# Patient Record
Sex: Male | Born: 1940 | Race: White | Hispanic: No | Marital: Married | Smoking: Never smoker
Health system: Southern US, Community
[De-identification: ages and names within clinical notes are randomized; demographics above are authoritative.]

## PROBLEM LIST (undated history)

## (undated) DIAGNOSIS — N289 Disorder of kidney and ureter, unspecified: Secondary | ICD-10-CM

## (undated) DIAGNOSIS — I2510 Atherosclerotic heart disease of native coronary artery without angina pectoris: Secondary | ICD-10-CM

## (undated) HISTORY — PX: APPENDECTOMY: SHX54

---

## 2015-02-04 DIAGNOSIS — I2510 Atherosclerotic heart disease of native coronary artery without angina pectoris: Secondary | ICD-10-CM

## 2015-02-04 DIAGNOSIS — E119 Type 2 diabetes mellitus without complications: Secondary | ICD-10-CM | POA: Insufficient documentation

## 2015-10-21 ENCOUNTER — Other Ambulatory Visit: Payer: Self-pay | Admitting: Student

## 2015-10-21 DIAGNOSIS — M7582 Other shoulder lesions, left shoulder: Principal | ICD-10-CM

## 2015-10-21 DIAGNOSIS — M778 Other enthesopathies, not elsewhere classified: Secondary | ICD-10-CM

## 2015-10-23 DIAGNOSIS — E782 Mixed hyperlipidemia: Secondary | ICD-10-CM | POA: Diagnosis present

## 2015-11-04 ENCOUNTER — Ambulatory Visit
Admission: RE | Admit: 2015-11-04 | Discharge: 2015-11-04 | Disposition: A | Discharge status: Home / Self Care | Payer: Medicare Other | Source: Ambulatory Visit | Attending: Student | Admitting: Student

## 2015-11-04 DIAGNOSIS — R937 Abnormal findings on diagnostic imaging of other parts of musculoskeletal system: Secondary | ICD-10-CM | POA: Insufficient documentation

## 2015-11-04 DIAGNOSIS — M7582 Other shoulder lesions, left shoulder: Secondary | ICD-10-CM | POA: Insufficient documentation

## 2015-11-04 DIAGNOSIS — M7552 Bursitis of left shoulder: Secondary | ICD-10-CM | POA: Diagnosis not present

## 2015-11-04 DIAGNOSIS — M62512 Muscle wasting and atrophy, not elsewhere classified, left shoulder: Secondary | ICD-10-CM | POA: Diagnosis not present

## 2015-11-04 DIAGNOSIS — M778 Other enthesopathies, not elsewhere classified: Secondary | ICD-10-CM

## 2015-11-04 DIAGNOSIS — M659 Synovitis and tenosynovitis, unspecified: Secondary | ICD-10-CM | POA: Insufficient documentation

## 2016-04-20 DIAGNOSIS — M1A00X0 Idiopathic chronic gout, unspecified site, without tophus (tophi): Secondary | ICD-10-CM | POA: Diagnosis present

## 2016-11-11 ENCOUNTER — Other Ambulatory Visit: Payer: Self-pay | Admitting: Anesthesiology

## 2016-11-11 DIAGNOSIS — M5416 Radiculopathy, lumbar region: Secondary | ICD-10-CM

## 2016-11-17 ENCOUNTER — Ambulatory Visit
Admission: RE | Admit: 2016-11-17 | Discharge: 2016-11-17 | Disposition: A | Discharge status: Home / Self Care | Payer: Medicare Other | Source: Ambulatory Visit | Attending: Anesthesiology | Admitting: Anesthesiology

## 2016-11-17 DIAGNOSIS — M4316 Spondylolisthesis, lumbar region: Secondary | ICD-10-CM | POA: Diagnosis not present

## 2016-11-17 DIAGNOSIS — I700 Atherosclerosis of aorta: Secondary | ICD-10-CM | POA: Diagnosis not present

## 2016-11-17 DIAGNOSIS — M8938 Hypertrophy of bone, other site: Secondary | ICD-10-CM | POA: Insufficient documentation

## 2016-11-17 DIAGNOSIS — Z981 Arthrodesis status: Secondary | ICD-10-CM | POA: Insufficient documentation

## 2016-11-17 DIAGNOSIS — M5416 Radiculopathy, lumbar region: Secondary | ICD-10-CM | POA: Diagnosis present

## 2017-06-22 ENCOUNTER — Other Ambulatory Visit: Payer: Self-pay | Admitting: Anesthesiology

## 2017-06-22 DIAGNOSIS — M5414 Radiculopathy, thoracic region: Secondary | ICD-10-CM

## 2017-07-01 ENCOUNTER — Ambulatory Visit
Admission: RE | Admit: 2017-07-01 | Discharge: 2017-07-01 | Disposition: A | Discharge status: Home / Self Care | Payer: Medicare Other | Source: Ambulatory Visit | Attending: Anesthesiology | Admitting: Anesthesiology

## 2017-07-01 DIAGNOSIS — M5414 Radiculopathy, thoracic region: Secondary | ICD-10-CM | POA: Diagnosis present

## 2017-07-01 DIAGNOSIS — M4724 Other spondylosis with radiculopathy, thoracic region: Secondary | ICD-10-CM | POA: Diagnosis not present

## 2017-08-31 HISTORY — PX: OTHER SURGICAL HISTORY: SHX169

## 2017-09-04 ENCOUNTER — Emergency Department
Admission: EM | Admit: 2017-09-04 | Discharge: 2017-09-04 | Disposition: A | Discharge status: Home / Self Care | Payer: Medicare Other | Attending: Emergency Medicine | Admitting: Emergency Medicine

## 2017-09-04 ENCOUNTER — Other Ambulatory Visit: Payer: Self-pay

## 2017-09-04 ENCOUNTER — Encounter: Payer: Self-pay | Admitting: Emergency Medicine

## 2017-09-04 ENCOUNTER — Emergency Department: Payer: Medicare Other

## 2017-09-04 DIAGNOSIS — K5900 Constipation, unspecified: Secondary | ICD-10-CM | POA: Insufficient documentation

## 2017-09-04 MED ORDER — LACTULOSE 20 G PO PACK: 20.0000 g | PACK | Freq: Three times a day (TID) | ORAL | 0 refills | Status: AC

## 2017-09-04 NOTE — ED Triage Notes (Signed)
Spoke to the surgeon re: the back pain and he increased the oxycodone from 5 to 10mg  q 6 hrs.

## 2017-09-04 NOTE — ED Provider Notes (Signed)
[facility] Emergency Department Provider Note  ____________________________________________   I have reviewed the triage vital signs and the nursing notes. Where available I have reviewed prior notes and, if possible and indicated, outside hospital notes.    HISTORY  Chief Complaint No chief complaint on file.    HPI Alec White is a 77 y.o. male  Who had recent surgery on his back, and has been on narcotic pain medication since Wednesday, he has not had a bowel movement this week.  He is not placed on a stool softeners.  He has no numbness or weakness, he does feels constipated.  He has no incontinence of bowel or bladder he has no fever no chills no abdominal pain, no chest pain or shortness of breath, no weakness he just states he is having trouble with bowel movements after taking narcotics.   History reviewed. No pertinent past medical history.  There are no active problems to display for this patient.   Past Surgical History:  Procedure Laterality Date  . spinal neurostimulator  08/31/2017    Prior to Admission medications   Not on File    Allergies Patient has no known allergies.  History reviewed. No pertinent family history.  Social History Social History   Tobacco Use  . Smoking status: Never Smoker  . Smokeless tobacco: Never Used  Substance Use Topics  . Alcohol use: Not Currently  . Drug use: Not on file    Review of Systems Constitutional: No fever/chills Eyes: No visual changes. ENT: No sore throat. No stiff neck no neck pain Cardiovascular: Denies chest pain. Respiratory: Denies shortness of breath. Gastrointestinal:   no vomiting.  No diarrhea.  No constipation. Genitourinary: Negative for dysuria. Musculoskeletal: Negative lower extremity swelling Skin: Negative for rash. Neurological: Negative for severe headaches, focal weakness or numbness.   ____________________________________________   PHYSICAL  EXAM:  VITAL SIGNS: ED Triage Vitals  Enc Vitals Group     BP 09/04/17 1704 (!) 151/62     Pulse Rate 09/04/17 1704 73     Resp 09/04/17 1704 18     Temp 09/04/17 1704 98.7 F (37.1 C)     Temp src --      SpO2 09/04/17 1704 97 %     Weight 09/04/17 1708 206 lb (93.4 kg)     Height 09/04/17 1708 5\' 8"  (1.727 m)     Head Circumference --      Peak Flow --      Pain Score 09/04/17 1708 9     Pain Loc --      Pain Edu? --      Excl. in [place]? --     Constitutional: Alert and oriented. Well appearing and in no acute distress. Eyes: Conjunctivae are normal Head: Atraumatic HEENT: No congestion/rhinnorhea. Mucous membranes are moist.  Oropharynx non-erythematous Neck:   Nontender with no meningismus, no masses, no stridor Cardiovascular: Normal rate, regular rhythm. Grossly normal heart sounds.  Good peripheral circulation. Respiratory: Normal respiratory effort.  No retractions. Lungs CTAB. Abdominal: Soft and nontender. No distention. No guarding no rebound Back:  There is no focal tenderness or step off.  there is no midline tenderness there are no lesions noted. there is no CVA tenderness Musculoskeletal: No lower extremity tenderness, no upper extremity tenderness. No joint effusions, no DVT signs strong distal pulses no edema Neurologic:  Normal speech and language. No gross focal neurologic deficits are appreciated.  Good rectal tone, symmetric reflexes no saddle anesthesia Skin:  Skin is warm, dry and intact. No rash noted. Psychiatric: Mood and affect are normal. Speech and behavior are normal.  ____________________________________________   LABS (all labs ordered are listed, but only abnormal results are displayed)  Labs Reviewed - No data to display  Pertinent labs  results that were available during my care of the patient were reviewed by me and considered in my medical decision making (see chart for details). ____________________________________________  EKG  I  personally interpreted any EKGs ordered by me or triage  ____________________________________________  RADIOLOGY  Pertinent labs & imaging results that were available during my care of the patient were reviewed by me and considered in my medical decision making (see chart for details). If possible, patient and/or family made aware of any abnormal findings.  Dg Abdomen 1 View  Result Date: 09/04/2017 CLINICAL DATA:  Constipation, recent back surgery EXAM: ABDOMEN - 1 VIEW COMPARISON:  CT 11/17/2016 FINDINGS: Bilateral lung bases are clear. Ascending stimulator leads projecting at approximate T8-T9 level. Nonobstructed bowel-gas pattern with moderate to large stool in the colon. Surgical hardware in the lumbosacral spine. Multiple calcifications projecting over the expected location of left kidney. Probable phleboliths in the left pelvis. IMPRESSION: 1. Nonobstructed gas pattern with moderate to large amount of stool in the colon 2. Multiple calcifications overlying the expected location of left kidney consistent with renal calculi. Electronically Signed   By: Donavan Foil M.D.   On: 09/04/2017 18:05   ____________________________________________    PROCEDURES  Procedure(s) performed: None  Procedures  Critical Care performed: None  ____________________________________________   INITIAL IMPRESSION / ASSESSMENT AND PLAN / ED COURSE  Pertinent labs & imaging results that were available during my care of the patient were reviewed by me and considered in my medical decision making (see chart for details).  Here with constipation no warning signs to suggest that he has a neurologic pathology causing this even though he did have recent implantation of a non-activated nerve stimulator.  The site looks good.  We offered an enema but he would prefer not to have an enema.  We then did an disimpaction with very good success.  Patient eager to go home he would like to start taking medications at  home.  We will prescribe him medications that should work with his narcotics.  We will also encourage him to take Colace which he started taking today, and he understands he can take over-the-counter laxatives as needed.  Any neurologic complaints of course understands would mandate return.    ____________________________________________   FINAL CLINICAL IMPRESSION(S) / ED DIAGNOSES  Final diagnoses:  None      This chart was dictated using voice recognition software.  Despite best efforts to proofread,  errors can occur which can change meaning.      Schuyler Amor, MD 09/04/17 1924

## 2017-09-04 NOTE — Discharge Instructions (Signed)
Return to the emergency room for any new or worsening symptoms abdominal pain, fever, vomiting, numbness, weakness, or you have any other new or worrisome symptoms.  Continue taking the Colace and try the lactulose to continue with your bowel prep.

## 2017-09-04 NOTE — ED Triage Notes (Signed)
Back surgery for spine stimulator on Wednesday. Constipated since then. Took a stool softener 2x today and the preceding days as well with no results.

## 2018-07-12 ENCOUNTER — Other Ambulatory Visit (HOSPITAL_COMMUNITY): Payer: Self-pay | Admitting: Internal Medicine

## 2018-07-12 ENCOUNTER — Other Ambulatory Visit: Payer: Self-pay | Admitting: Internal Medicine

## 2018-07-12 DIAGNOSIS — N183 Chronic kidney disease, stage 3 unspecified: Secondary | ICD-10-CM

## 2018-07-14 ENCOUNTER — Other Ambulatory Visit: Payer: Self-pay

## 2018-07-14 ENCOUNTER — Ambulatory Visit
Admission: RE | Admit: 2018-07-14 | Discharge: 2018-07-14 | Disposition: A | Discharge status: Home / Self Care | Payer: Medicare Other | Source: Ambulatory Visit | Attending: Internal Medicine | Admitting: Internal Medicine

## 2018-07-14 DIAGNOSIS — N183 Chronic kidney disease, stage 3 unspecified: Secondary | ICD-10-CM

## 2018-07-20 ENCOUNTER — Other Ambulatory Visit: Payer: Self-pay | Admitting: Internal Medicine

## 2018-07-20 ENCOUNTER — Other Ambulatory Visit (HOSPITAL_COMMUNITY): Payer: Self-pay | Admitting: Internal Medicine

## 2018-07-20 DIAGNOSIS — N289 Disorder of kidney and ureter, unspecified: Secondary | ICD-10-CM

## 2018-07-20 DIAGNOSIS — N281 Cyst of kidney, acquired: Secondary | ICD-10-CM

## 2018-07-25 ENCOUNTER — Other Ambulatory Visit: Payer: Self-pay | Admitting: Internal Medicine

## 2018-07-25 ENCOUNTER — Ambulatory Visit
Admission: RE | Admit: 2018-07-25 | Discharge: 2018-07-25 | Disposition: A | Discharge status: Home / Self Care | Payer: Medicare Other | Source: Ambulatory Visit | Attending: Internal Medicine | Admitting: Internal Medicine

## 2018-07-25 ENCOUNTER — Other Ambulatory Visit: Payer: Self-pay

## 2018-07-25 DIAGNOSIS — N281 Cyst of kidney, acquired: Secondary | ICD-10-CM | POA: Diagnosis present

## 2018-07-25 DIAGNOSIS — N289 Disorder of kidney and ureter, unspecified: Secondary | ICD-10-CM | POA: Diagnosis present

## 2021-02-19 ENCOUNTER — Other Ambulatory Visit: Payer: Self-pay | Admitting: Internal Medicine

## 2021-02-19 DIAGNOSIS — N184 Chronic kidney disease, stage 4 (severe): Secondary | ICD-10-CM

## 2021-02-19 DIAGNOSIS — N183 Chronic kidney disease, stage 3 unspecified: Secondary | ICD-10-CM

## 2021-02-19 DIAGNOSIS — E1122 Type 2 diabetes mellitus with diabetic chronic kidney disease: Secondary | ICD-10-CM

## 2021-03-02 ENCOUNTER — Ambulatory Visit
Admission: RE | Admit: 2021-03-02 | Discharge: 2021-03-02 | Disposition: A | Discharge status: Home / Self Care | Payer: Medicare Other | Source: Ambulatory Visit | Attending: Internal Medicine | Admitting: Internal Medicine

## 2021-03-02 DIAGNOSIS — N184 Chronic kidney disease, stage 4 (severe): Secondary | ICD-10-CM | POA: Insufficient documentation

## 2021-03-02 DIAGNOSIS — N183 Chronic kidney disease, stage 3 unspecified: Secondary | ICD-10-CM | POA: Diagnosis present

## 2021-03-02 DIAGNOSIS — E1122 Type 2 diabetes mellitus with diabetic chronic kidney disease: Secondary | ICD-10-CM | POA: Insufficient documentation

## 2021-06-10 DIAGNOSIS — E1122 Type 2 diabetes mellitus with diabetic chronic kidney disease: Secondary | ICD-10-CM | POA: Diagnosis present

## 2021-06-10 DIAGNOSIS — N400 Enlarged prostate without lower urinary tract symptoms: Secondary | ICD-10-CM | POA: Diagnosis present

## 2022-07-15 IMAGING — US US RENAL
1 series · 14 of 25 positions shown · non-contrast
Comparison: Ultrasound 07/14/2018

CLINICAL DATA: Chronic kidney disease

EXAM:
RENAL / URINARY TRACT ULTRASOUND COMPLETE

[Series 1: us renal · 0.26mm/px · 14 of 45 slices shown]
[im 1/45]
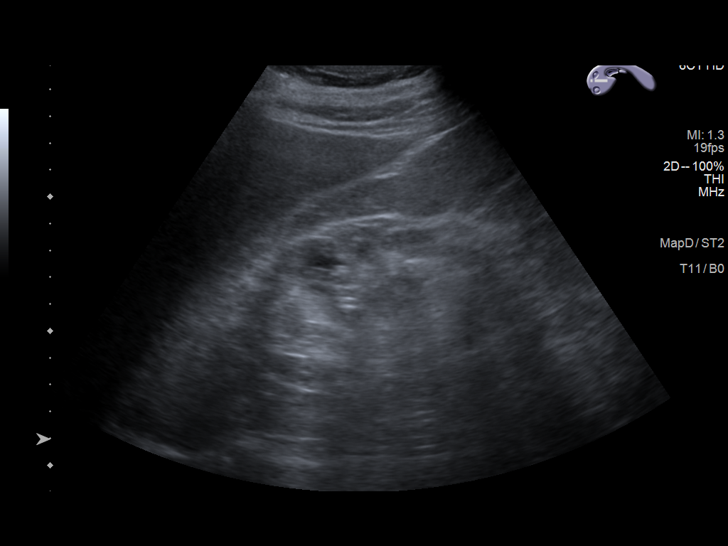
[im 4/45]
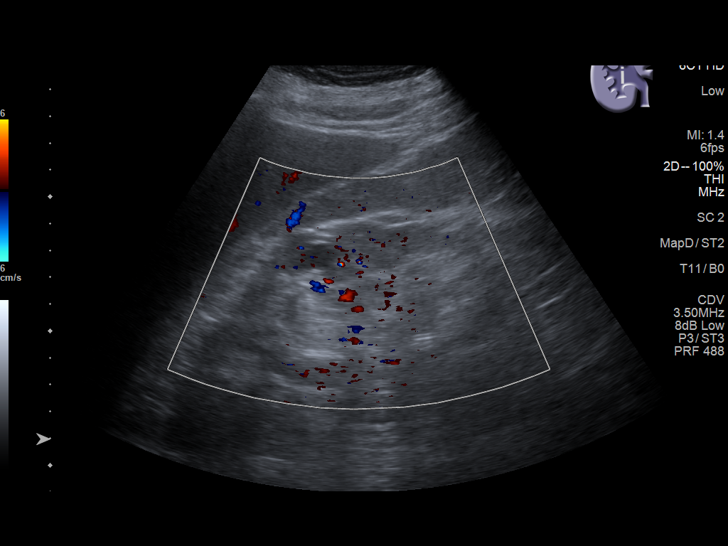
[im 8/45]
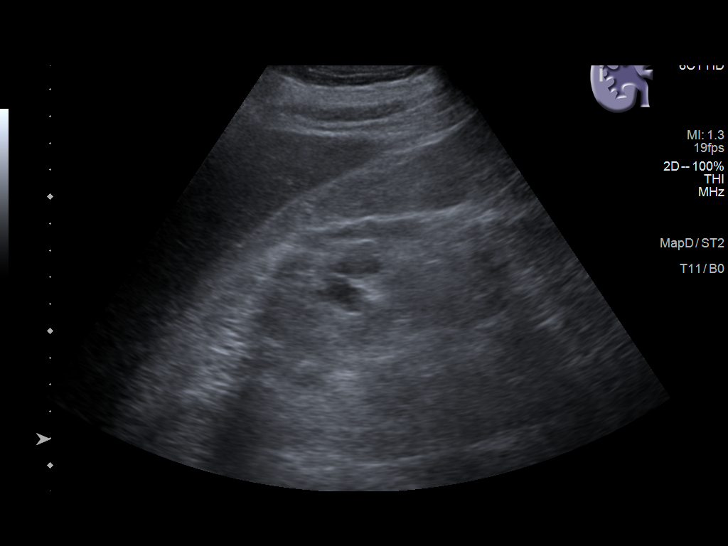
[im 12/45]
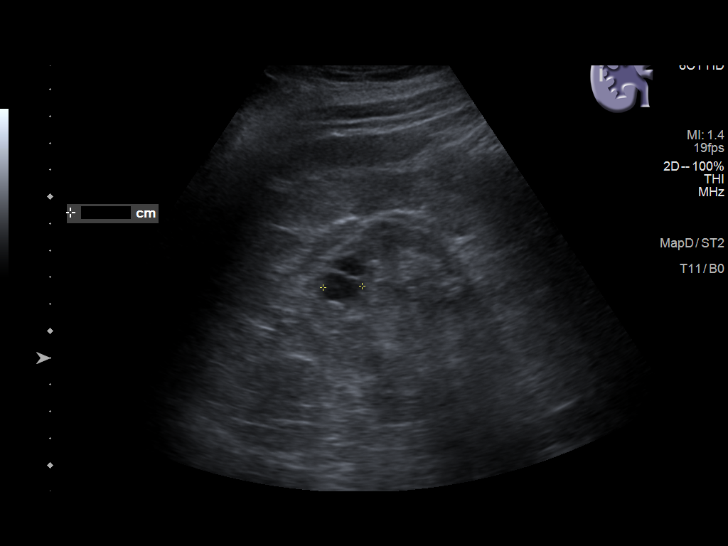
[im 15/45]
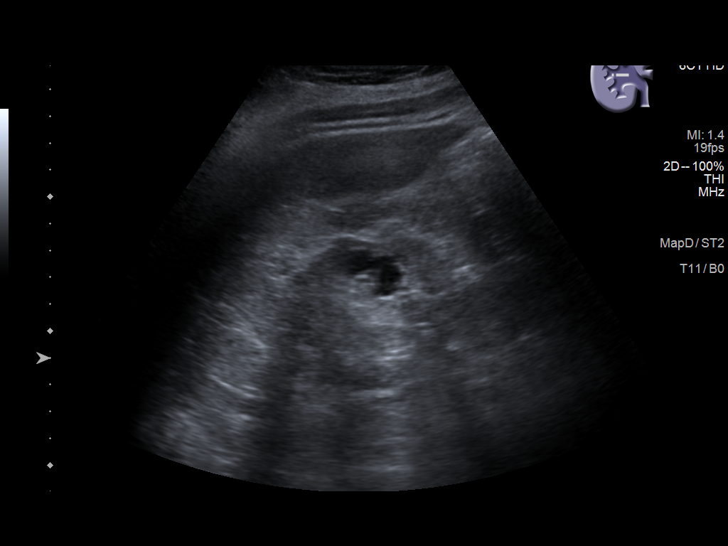
[im 17/45]
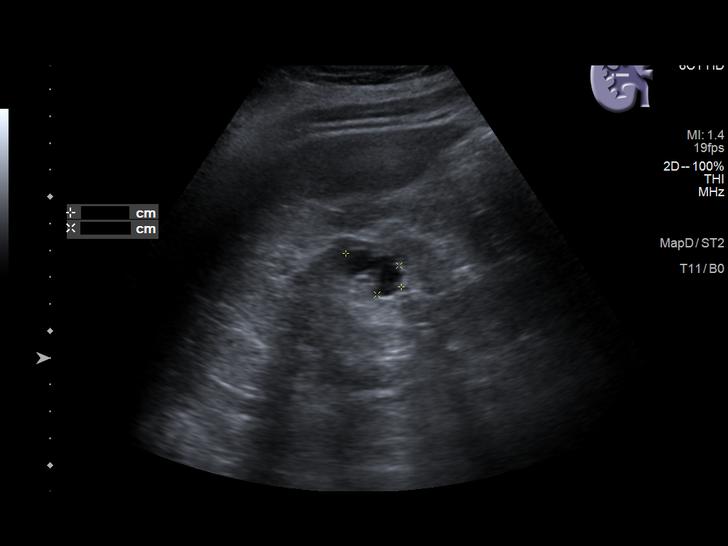
[im 21/45]
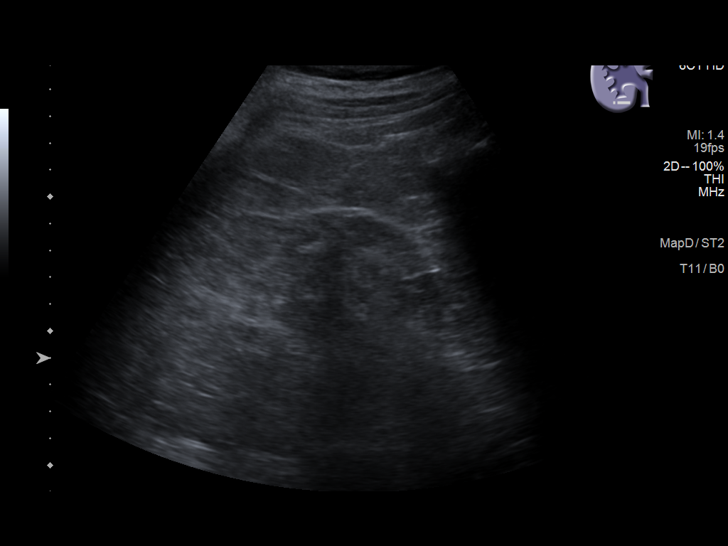
[im 24/45]
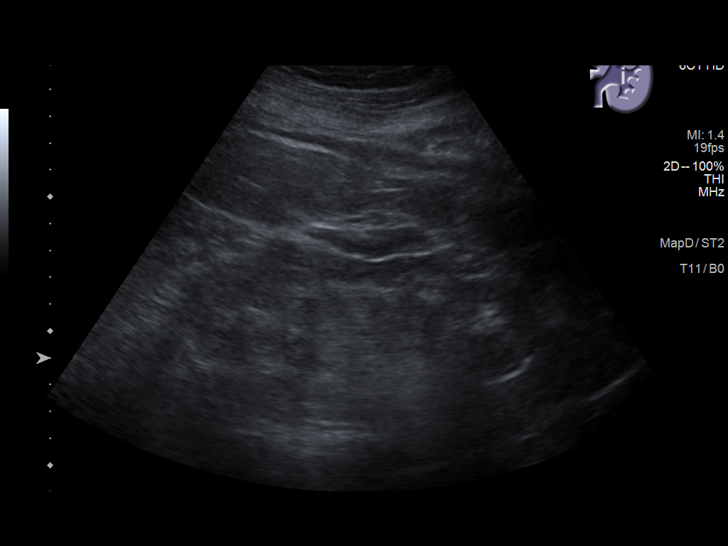
[im 28/45]
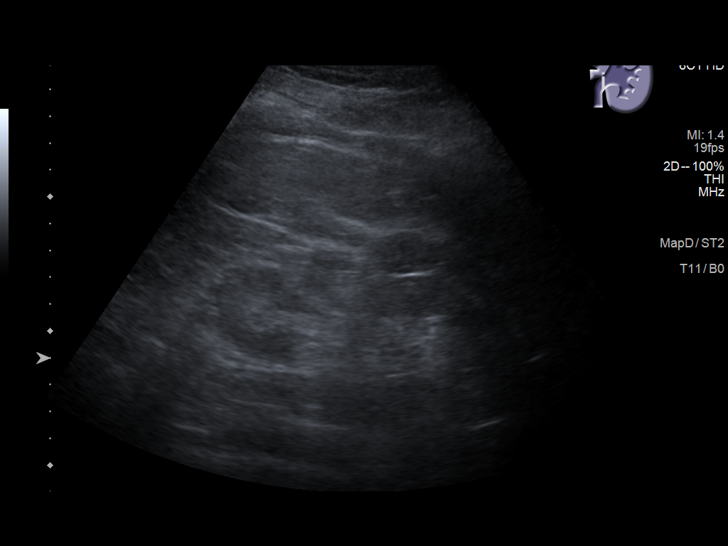
[im 30/45]
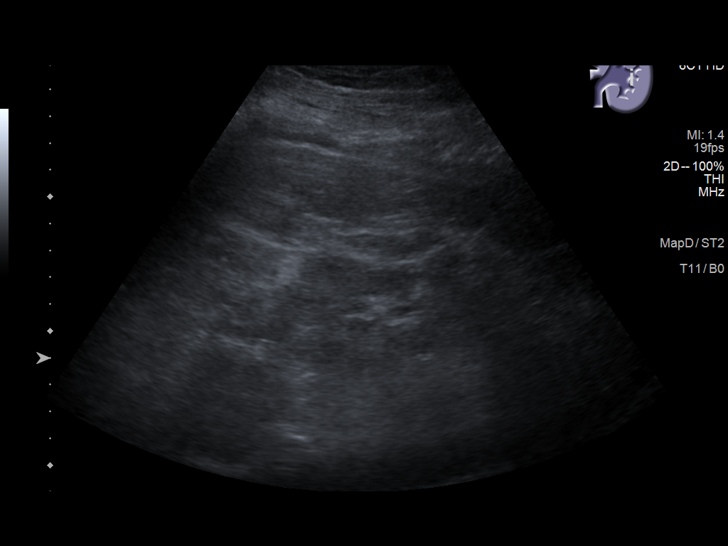
[im 34/45]
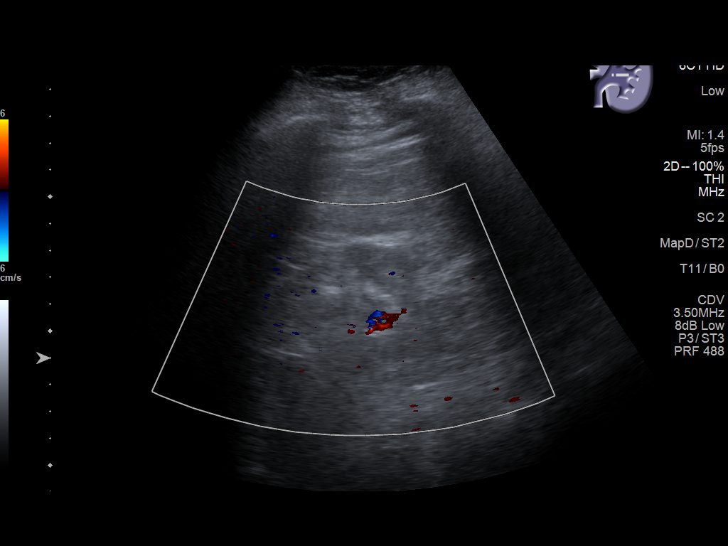
[im 37/45]
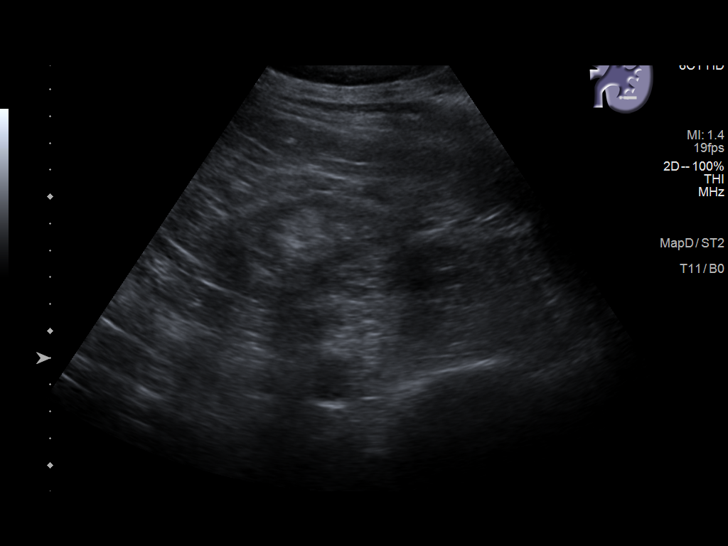
[im 41/45]
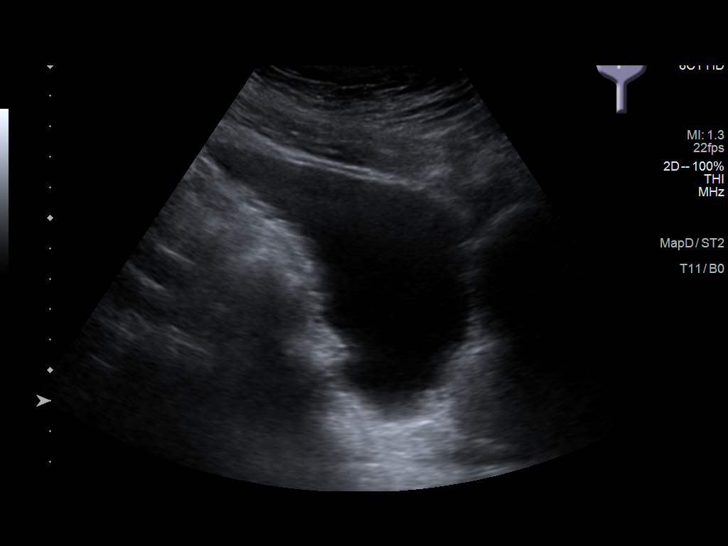
[im 45/45]
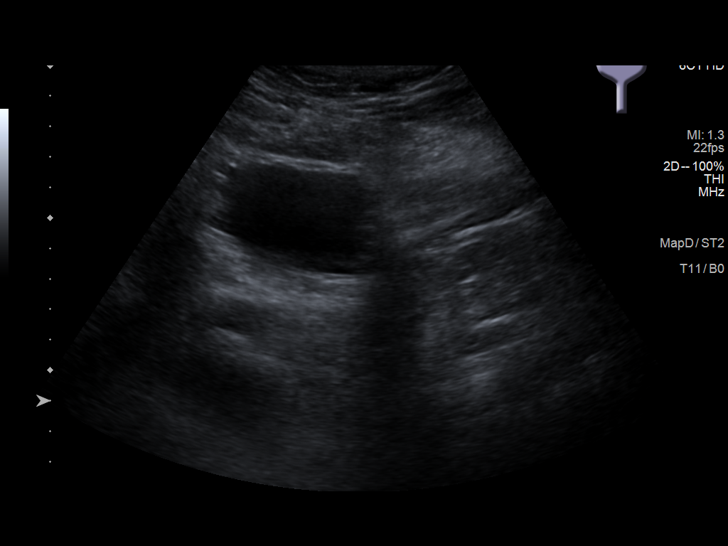

[14 of 25 positions shown; findings below may reference images not displayed]

FINDINGS: Right Kidney:

Renal measurements: 10.2 x 6.1 x 6.2 cm = volume: 200.6 mL. Cortex
is echogenic with diffuse cortical thinning. No hydronephrosis. Cyst
at the midpole measuring 2.4 x 1.4 by 1.8 cm. Cyst at the upper pole
measuring 1.4 x 1 x 1.5 cm.

Left Kidney:

Renal measurements: 13.5 x 5.8 x 6 cm = volume: 244.3 mL. Cortex is
echogenic with diffuse cortical thinning. No hydronephrosis. Solid
echogenic mass at the midpole measuring 1.3 x 1.3 x 1.6 cm,
previously 14 x 10 x 19 mm.

Bladder:

Appears normal for degree of bladder distention.

Other:

None.
IMPRESSION: 1. Echogenic kidneys with diffuse cortical thinning consistent with
medical renal disease and atrophy. No hydronephrosis
2. Simple appearing cysts in the right kidney
3. Stable hyperechoic solid lesion mid left kidney measuring 16 mm
suggestive of small angiomyolipoma.

## 2022-11-19 ENCOUNTER — Other Ambulatory Visit: Payer: Self-pay

## 2022-11-19 ENCOUNTER — Emergency Department: Payer: Medicare Other

## 2022-11-19 ENCOUNTER — Emergency Department
Admission: EM | Admit: 2022-11-19 | Discharge: 2022-11-19 | Disposition: A | Discharge status: Home / Self Care | Payer: Medicare Other | Attending: Emergency Medicine | Admitting: Emergency Medicine

## 2022-11-19 DIAGNOSIS — W1839XA Other fall on same level, initial encounter: Secondary | ICD-10-CM | POA: Insufficient documentation

## 2022-11-19 DIAGNOSIS — E119 Type 2 diabetes mellitus without complications: Secondary | ICD-10-CM | POA: Insufficient documentation

## 2022-11-19 DIAGNOSIS — S7002XA Contusion of left hip, initial encounter: Secondary | ICD-10-CM | POA: Insufficient documentation

## 2022-11-19 DIAGNOSIS — W19XXXA Unspecified fall, initial encounter: Secondary | ICD-10-CM

## 2022-11-19 DIAGNOSIS — M25552 Pain in left hip: Secondary | ICD-10-CM | POA: Diagnosis present

## 2022-11-19 MED ORDER — ACETAMINOPHEN 325 MG PO TABS
650.0000 mg | ORAL_TABLET | Freq: Once | ORAL | Status: AC
  Administered 2022-11-19: 650 mg via ORAL
  Filled 2022-11-19: qty 2

## 2022-11-19 NOTE — ED Provider Notes (Signed)
[facility] Provider Note    Event Date/Time   First MD Initiated Contact with Patient 11/19/22 1214     (approximate)   History   Fall   HPI  Alec White is a 82 y.o. male who presents today for evaluation of left hip pain.  Patient reports that he lost his balance and fell directly onto his left hip approximately 1 hour ago.  He reports that he has not been able to bear weight since that happened given his discomfort.  No paresthesias.  There is no history of or LOC.  He does not take anticoagulation.  He denies any other injuries or pain.  There are no problems to display for this patient.         Physical Exam   Triage Vital Signs: ED Triage Vitals  Encounter Vitals Group     BP 11/19/22 1146 (!) 175/73     Systolic BP Percentile --      Diastolic BP Percentile --      Pulse Rate 11/19/22 1146 83     Resp 11/19/22 1146 18     Temp 11/19/22 1146 98 F (36.7 C)     Temp src --      SpO2 11/19/22 1146 97 %     Weight 11/19/22 1146 192 lb (87.1 kg)     Height 11/19/22 1146 5\' 7"  (1.702 m)     Head Circumference --      Peak Flow --      Pain Score 11/19/22 1145 6     Pain Loc --      Pain Education --      Exclude from Growth Chart --     Most recent vital signs: Vitals:   11/19/22 1146 11/19/22 1412  BP: (!) 175/73 (!) 185/82  Pulse: 83 82  Resp: 18 16  Temp: 98 F (36.7 C)   SpO2: 97% 98%    Physical Exam Vitals and nursing note reviewed.  Constitutional:      General: Awake and alert. No acute distress.    Appearance: Normal appearance. The patient is normal weight.  HENT:     Head: Normocephalic and atraumatic.     Mouth: Mucous membranes are moist.  Eyes:     General: PERRL. Normal EOMs        Right eye: No discharge.        Left eye: No discharge.     Conjunctiva/sclera: Conjunctivae normal.  Cardiovascular:     Rate and Rhythm: Normal rate and regular rhythm.     Pulses: Normal pulses.  Pulmonary:      Effort: Pulmonary effort is normal. No respiratory distress.     Breath sounds: Normal breath sounds.  Abdominal:     Abdomen is soft. There is no abdominal tenderness. No rebound or guarding. No distention. Pelvis stable.  Negative logroll bilaterally.  Patient is able to lift bilateral legs up off of the stretcher, 1 at a time.  He has 2+ pedal pulses.  There is no skin injury, erythema, or ecchymosis noted. Musculoskeletal:        General: No swelling. Normal range of motion.     Cervical back: Normal range of motion and neck supple.  Skin:    General: Skin is warm and dry.     Capillary Refill: Capillary refill takes less than 2 seconds.     Findings: No rash.  Neurological:     Mental Status: The patient is awake  and alert.      ED Results / Procedures / Treatments   Labs (all labs ordered are listed, but only abnormal results are displayed) Labs Reviewed - No data to display   EKG     RADIOLOGY I independently reviewed and interpreted imaging and agree with radiologists findings.     PROCEDURES:  Critical Care performed:   Procedures   MEDICATIONS ORDERED IN ED: Medications  acetaminophen (TYLENOL) tablet 650 mg (650 mg Oral Given 11/19/22 1403)     IMPRESSION / MDM / ASSESSMENT AND PLAN / ED COURSE  I reviewed the triage vital signs and the nursing notes.   Differential diagnosis includes, but is not limited to, contusion, fracture, dislocation.    Patient is awake and alert, hemodynamically stable and afebrile.  There is no obvious deformity, rotation, or leg shortening on exam.  He has normal distal pulses.  X-ray obtained is negative for any acute bony injury.  Patient and family are reassured by these results.  He had initially declined analgesia, though then agreed to Tylenol which was provided.  We discussed return precautions and outpatient follow-up.  He is given the information for orthopedics if his symptoms persist.  He understands return  precautions in the meantime.  He and his family members are comfortable with plan.  He was discharged in stable condition with his family.   Patient's presentation is most consistent with acute complicated illness / injury requiring diagnostic workup.    FINAL CLINICAL IMPRESSION(S) / ED DIAGNOSES   Final diagnoses:  Fall, initial encounter  Contusion of left hip, initial encounter     Rx / DC Orders   ED Discharge Orders     None        Note:  This document was prepared using Dragon voice recognition software and may include unintentional dictation errors.   Jackelyn Hoehn, PA-C 11/19/22 1414    Janith Lima, MD 11/19/22 [name]

## 2022-11-19 NOTE — ED Triage Notes (Signed)
[facility] home ems.  Sitting in chair and stood and his legs gave out and fell on left hip.  No deformity.  Painful to wt bear.  Hx DM  bs 206 by ems.  Vss 164/80

## 2022-11-19 NOTE — ED Triage Notes (Signed)
Pt comes via EMS from home with c/o fall. Pt stats he has bad back and loss his balance falling. Pt states no loc or hitting head. Pt states he just hit his back side.

## 2022-11-19 NOTE — Discharge Instructions (Signed)
Your x-ray did not show any broken bones.  You may take Tylenol 650 mg every 6-8 hours as needed for pain.  Please follow-up with orthopedics if your symptoms persist.  Please return for any new, worsening, or change in symptoms or other concerns.  It was a pleasure caring for you today.

## 2022-11-25 ENCOUNTER — Emergency Department: Payer: Medicare Other

## 2022-11-25 ENCOUNTER — Inpatient Hospital Stay
Admission: EM | Admit: 2022-11-25 | Discharge: 2022-11-27 | DRG: 481 | Disposition: A | Discharge status: Home Health | Payer: Medicare Other | Source: Ambulatory Visit | Attending: Internal Medicine | Admitting: Internal Medicine

## 2022-11-25 ENCOUNTER — Other Ambulatory Visit: Payer: Self-pay

## 2022-11-25 ENCOUNTER — Encounter: Payer: Self-pay | Admitting: *Deleted

## 2022-11-25 DIAGNOSIS — I2510 Atherosclerotic heart disease of native coronary artery without angina pectoris: Secondary | ICD-10-CM | POA: Diagnosis present

## 2022-11-25 DIAGNOSIS — M25552 Pain in left hip: Secondary | ICD-10-CM | POA: Diagnosis present

## 2022-11-25 DIAGNOSIS — E782 Mixed hyperlipidemia: Secondary | ICD-10-CM | POA: Diagnosis present

## 2022-11-25 DIAGNOSIS — S72012A Unspecified intracapsular fracture of left femur, initial encounter for closed fracture: Principal | ICD-10-CM | POA: Diagnosis present

## 2022-11-25 DIAGNOSIS — S72002A Fracture of unspecified part of neck of left femur, initial encounter for closed fracture: Secondary | ICD-10-CM | POA: Diagnosis not present

## 2022-11-25 DIAGNOSIS — M1A9XX0 Chronic gout, unspecified, without tophus (tophi): Secondary | ICD-10-CM | POA: Diagnosis present

## 2022-11-25 DIAGNOSIS — Z79899 Other long term (current) drug therapy: Secondary | ICD-10-CM

## 2022-11-25 DIAGNOSIS — E039 Hypothyroidism, unspecified: Secondary | ICD-10-CM | POA: Insufficient documentation

## 2022-11-25 DIAGNOSIS — E1122 Type 2 diabetes mellitus with diabetic chronic kidney disease: Secondary | ICD-10-CM | POA: Diagnosis present

## 2022-11-25 DIAGNOSIS — Z955 Presence of coronary angioplasty implant and graft: Secondary | ICD-10-CM

## 2022-11-25 DIAGNOSIS — W19XXXA Unspecified fall, initial encounter: Secondary | ICD-10-CM | POA: Diagnosis present

## 2022-11-25 DIAGNOSIS — Z9682 Presence of neurostimulator: Secondary | ICD-10-CM | POA: Diagnosis not present

## 2022-11-25 DIAGNOSIS — G8929 Other chronic pain: Secondary | ICD-10-CM | POA: Insufficient documentation

## 2022-11-25 DIAGNOSIS — I10 Essential (primary) hypertension: Secondary | ICD-10-CM | POA: Diagnosis not present

## 2022-11-25 DIAGNOSIS — N400 Enlarged prostate without lower urinary tract symptoms: Secondary | ICD-10-CM | POA: Diagnosis present

## 2022-11-25 DIAGNOSIS — M1A00X0 Idiopathic chronic gout, unspecified site, without tophus (tophi): Secondary | ICD-10-CM | POA: Diagnosis not present

## 2022-11-25 DIAGNOSIS — N184 Chronic kidney disease, stage 4 (severe): Secondary | ICD-10-CM

## 2022-11-25 DIAGNOSIS — R296 Repeated falls: Secondary | ICD-10-CM | POA: Diagnosis present

## 2022-11-25 DIAGNOSIS — I129 Hypertensive chronic kidney disease with stage 1 through stage 4 chronic kidney disease, or unspecified chronic kidney disease: Secondary | ICD-10-CM | POA: Diagnosis present

## 2022-11-25 DIAGNOSIS — Z79890 Hormone replacement therapy (postmenopausal): Secondary | ICD-10-CM | POA: Diagnosis not present

## 2022-11-25 DIAGNOSIS — Z7982 Long term (current) use of aspirin: Secondary | ICD-10-CM

## 2022-11-25 DIAGNOSIS — Z981 Arthrodesis status: Secondary | ICD-10-CM | POA: Diagnosis not present

## 2022-11-25 DIAGNOSIS — Z7984 Long term (current) use of oral hypoglycemic drugs: Secondary | ICD-10-CM

## 2022-11-25 DIAGNOSIS — Z79891 Long term (current) use of opiate analgesic: Secondary | ICD-10-CM

## 2022-11-25 DIAGNOSIS — Z9861 Coronary angioplasty status: Secondary | ICD-10-CM | POA: Diagnosis not present

## 2022-11-25 LAB — CBC WITH DIFFERENTIAL/PLATELET
Abs Immature Granulocytes: 0.03 10*3/uL (ref 0.00–0.07)
Basophils Absolute: 0 10*3/uL (ref 0.0–0.1)
Basophils Relative: 1 %
Eosinophils Absolute: 0.3 10*3/uL (ref 0.0–0.5)
Eosinophils Relative: 5 %
HCT: 42.5 % (ref 39.0–52.0)
Hemoglobin: 13.7 g/dL (ref 13.0–17.0)
Immature Granulocytes: 1 %
Lymphocytes Relative: 23 %
Lymphs Abs: 1.5 10*3/uL (ref 0.7–4.0)
MCH: 30.2 pg (ref 26.0–34.0)
MCHC: 32.2 g/dL (ref 30.0–36.0)
MCV: 93.6 fL (ref 80.0–100.0)
Monocytes Absolute: 0.4 10*3/uL (ref 0.1–1.0)
Monocytes Relative: 7 %
Neutro Abs: 4.1 10*3/uL (ref 1.7–7.7)
Neutrophils Relative %: 63 %
Platelets: 122 10*3/uL — ABNORMAL LOW (ref 150–400)
RBC: 4.54 MIL/uL (ref 4.22–5.81)
RDW: 13.2 % (ref 11.5–15.5)
WBC: 6.3 10*3/uL (ref 4.0–10.5)
nRBC: 0 % (ref 0.0–0.2)

## 2022-11-25 LAB — BASIC METABOLIC PANEL
Anion gap: 14 (ref 5–15)
BUN: 46 mg/dL — ABNORMAL HIGH (ref 8–23)
CO2: 23 mmol/L (ref 22–32)
Calcium: 9.6 mg/dL (ref 8.9–10.3)
Chloride: 103 mmol/L (ref 98–111)
Creatinine, Ser: 2.45 mg/dL — ABNORMAL HIGH (ref 0.61–1.24)
GFR, Estimated: 26 mL/min — ABNORMAL LOW (ref >60)
Glucose, Bld: 126 mg/dL — ABNORMAL HIGH (ref 70–99)
Potassium: 4.6 mmol/L (ref 3.5–5.1)
Sodium: 140 mmol/L (ref 135–145)

## 2022-11-25 LAB — PROTIME-INR
INR: 1.1 (ref 0.8–1.2)
Prothrombin Time: 14 s (ref 11.4–15.2)

## 2022-11-25 MED ORDER — MORPHINE SULFATE (PF) 2 MG/ML IV SOLN
0.5000 mg | INTRAVENOUS | Status: DC | PRN
  Administered 2022-11-26: 0.5 mg via INTRAVENOUS
  Filled 2022-11-25: qty 1

## 2022-11-25 MED ORDER — HYDRALAZINE HCL 20 MG/ML IJ SOLN
5.0000 mg | INTRAMUSCULAR | Status: DC | PRN
  Administered 2022-11-26: 5 mg via INTRAVENOUS
  Filled 2022-11-25: qty 1

## 2022-11-25 MED ORDER — MELATONIN 5 MG PO TABS
5.0000 mg | ORAL_TABLET | Freq: Every day | ORAL | Status: DC
  Administered 2022-11-26: 5 mg via ORAL
  Filled 2022-11-25: qty 1

## 2022-11-25 MED ORDER — ALLOPURINOL 100 MG PO TABS
100.0000 mg | ORAL_TABLET | Freq: Every day | ORAL | Status: DC
  Administered 2022-11-27: 100 mg via ORAL
  Filled 2022-11-25 (×2): qty 1

## 2022-11-25 MED ORDER — EMPAGLIFLOZIN 10 MG PO TABS
10.0000 mg | ORAL_TABLET | Freq: Every day | ORAL | Status: DC
  Filled 2022-11-25: qty 1

## 2022-11-25 MED ORDER — AMLODIPINE BESYLATE 5 MG PO TABS
5.0000 mg | ORAL_TABLET | Freq: Every day | ORAL | Status: DC
  Administered 2022-11-26 – 2022-11-27 (×2): 5 mg via ORAL
  Filled 2022-11-25 (×2): qty 1

## 2022-11-25 MED ORDER — LOSARTAN POTASSIUM 50 MG PO TABS: 100.0000 mg | ORAL_TABLET | Freq: Every day | ORAL | Status: DC

## 2022-11-25 MED ORDER — METOPROLOL SUCCINATE ER 50 MG PO TB24
100.0000 mg | ORAL_TABLET | Freq: Every day | ORAL | Status: DC
  Administered 2022-11-26 – 2022-11-27 (×2): 100 mg via ORAL
  Filled 2022-11-25 (×2): qty 2

## 2022-11-25 MED ORDER — METHOCARBAMOL 1000 MG/10ML IJ SOLN: 500.0000 mg | Freq: Four times a day (QID) | INTRAVENOUS | Status: DC | PRN

## 2022-11-25 MED ORDER — ONDANSETRON HCL 4 MG/2ML IJ SOLN: 4.0000 mg | Freq: Four times a day (QID) | INTRAMUSCULAR | Status: DC | PRN

## 2022-11-25 MED ORDER — ACETAMINOPHEN 325 MG PO TABS
650.0000 mg | ORAL_TABLET | Freq: Four times a day (QID) | ORAL | Status: DC | PRN
  Administered 2022-11-25: 650 mg via ORAL
  Filled 2022-11-25: qty 2

## 2022-11-25 MED ORDER — ATORVASTATIN CALCIUM 20 MG PO TABS
40.0000 mg | ORAL_TABLET | Freq: Every day | ORAL | Status: DC
  Administered 2022-11-27: 40 mg via ORAL
  Filled 2022-11-25: qty 2

## 2022-11-25 MED ORDER — HYDROCODONE-ACETAMINOPHEN 5-325 MG PO TABS: 1.0000 | ORAL_TABLET | Freq: Four times a day (QID) | ORAL | Status: DC | PRN

## 2022-11-25 MED ORDER — METHOCARBAMOL 500 MG PO TABS: 500.0000 mg | ORAL_TABLET | Freq: Four times a day (QID) | ORAL | Status: DC | PRN

## 2022-11-25 MED ORDER — ACETAMINOPHEN 650 MG RE SUPP: 650.0000 mg | Freq: Four times a day (QID) | RECTAL | Status: DC | PRN

## 2022-11-25 MED ORDER — INSULIN ASPART 100 UNIT/ML IJ SOLN
0.0000 [IU] | INTRAMUSCULAR | Status: DC
  Administered 2022-11-26: 2 [IU] via SUBCUTANEOUS
  Administered 2022-11-27: 3 [IU] via SUBCUTANEOUS
  Filled 2022-11-25 (×2): qty 1

## 2022-11-25 MED ORDER — LEVOTHYROXINE SODIUM 50 MCG PO TABS
200.0000 ug | ORAL_TABLET | Freq: Every day | ORAL | Status: DC
  Administered 2022-11-27: 200 ug via ORAL
  Filled 2022-11-25: qty 4

## 2022-11-25 NOTE — ED Triage Notes (Addendum)
Pt to triage via wheelchair.  Pt sent from [facility] for eval of hip fx.  Pt has left hip pain.  Pt fell 1 week ago in his home after getting up out of the chair per pt.  Pt alert  speech clear.    Pt had a xray last week, negative for left hip fx.  Pt reports pmd requesting a ct scan of hip.  Pt also reports after initial fall, he fell again the next day and has continued to get worse with pain and inability to ambulate.

## 2022-11-25 NOTE — Assessment & Plan Note (Addendum)
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  Prescribed a small amount of oxycodone just in case has breakthrough pain.

## 2022-11-25 NOTE — ED Notes (Signed)
Pt assisted with urinal

## 2022-11-25 NOTE — Progress Notes (Signed)
Full consult note and discussion with patient to follow tomorrow.  Called by ED staff. Imaging reviewed.  - Plan for surgery tomorrow. - NPO after midnight - Hold anticoagulation - Admit to Hospitalist team.

## 2022-11-25 NOTE — Assessment & Plan Note (Signed)
Continue allopurinol 100 mg daily. 

## 2022-11-25 NOTE — H&P (Signed)
History and Physical    Patient: Alec White PXT:[id number] DOB: [birth year] DOA: 11/25/2022 DOS: the patient was seen and examined on 11/25/2022 PCP: Danella Penton, MD  Patient coming from: Home  Chief Complaint:  Chief Complaint  Patient presents with   Hip Pain    HPI: Alec White is a 82 y.o. male with medical history significant for Hypertension, hyperlipidemia, CKD 4, hypothyroidism chronic back pain s/p cervical and lumbar fusion on chronic opioids/Butrans patch, non-insulin-dependent type 2 diabetes, BPH, CAD, who was sent from the urgent care for evaluation for hip fracture.  Patient fell a week ago after getting up out of a chair and presented to the ED with hip pain and hip x-ray was negative for fracture.  He was discharged from the ED however he fell again the following day and he continued to have increasing pain with decreasing ability to ambulate.  He saw his PCP earlier in the day who directed him to the ED for CT to evaluate for hip fracture.  Patient has otherwise been in his usual state of health.  He denied preceding symptoms prior to the fall such as palpitations, chest pain, shortness of breath, visual disturbance, lightheadedness, one-sided weakness numbness or tingling.  He denies fever, cough, vomiting or diarrhea or abdominal pain or dysuria. ED course and data review: BP 162/84 with otherwise normal vitals Labs notable for creatinine of 2.45 which is patient's baseline.  Hemoglobin normal and labs otherwise unremarkable. EKG, personally Reviewed and interpreted shows NSR at 74 with no acute ST-T wave changes CT of the left hip showed impacted minimally angulated subcapital femoral neck fracture on the left Chest x-ray showed no active disease The ED provider spoke with orthopedist, Dr. Signa Kell who would like to take patient to the OR on 10/11. Hospitalist consulted for admission.   Review of Systems: As mentioned in the history of present illness. All other  systems reviewed and are negative.  History reviewed. No pertinent past medical history. Past Surgical History:  Procedure Laterality Date   spinal neurostimulator  08/31/2017   Social History:  reports that he has never smoked. He has never used smokeless tobacco. He reports that he does not currently use alcohol. No history on file for drug use.  No Known Allergies  History reviewed. No pertinent family history.  Prior to Admission medications   Not on File    Physical Exam: Vitals:   11/25/22 1541 11/25/22 1542 11/25/22 2213  BP: (!) 162/84    Pulse: 78    Resp: 20    Temp: 97.9 F (36.6 C)  98 F (36.7 C)  TempSrc:   Oral  SpO2: 98%    Weight:  87.1 kg   Height:  5\' 7"  (1.702 m)    Physical Exam Vitals and nursing note reviewed.  Constitutional:      General: He is not in acute distress. HENT:     Head: Normocephalic and atraumatic.  Cardiovascular:     Rate and Rhythm: Normal rate and regular rhythm.     Heart sounds: Normal heart sounds.  Pulmonary:     Effort: Pulmonary effort is normal.     Breath sounds: Normal breath sounds.  Abdominal:     Palpations: Abdomen is soft.     Tenderness: There is no abdominal tenderness.  Neurological:     Mental Status: Mental status is at baseline.     Labs on Admission: I have personally reviewed following labs and imaging studies  CBC: Recent Labs  Lab 11/25/22 2056  WBC 6.3  NEUTROABS 4.1  HGB 13.7  HCT 42.5  MCV 93.6  PLT 122*   Basic Metabolic Panel: Recent Labs  Lab 11/25/22 2056  NA 140  K 4.6  CL 103  CO2 23  GLUCOSE 126*  BUN 46*  CREATININE 2.45*  CALCIUM 9.6   GFR: Estimated Creatinine Clearance: 24.9 mL/min (A) (by C-G formula based on SCr of 2.45 mg/dL (H)). Liver Function Tests: No results for input(s): "AST", "ALT", "ALKPHOS", "BILITOT", "PROT", "ALBUMIN" in the last 168 hours. No results for input(s): "LIPASE", "AMYLASE" in the last 168 hours. No results for input(s): "AMMONIA"  in the last 168 hours. Coagulation Profile: Recent Labs  Lab 11/25/22 2116  INR 1.1   Cardiac Enzymes: No results for input(s): "CKTOTAL", "CKMB", "CKMBINDEX", "TROPONINI" in the last 168 hours. BNP (last 3 results) No results for input(s): "PROBNP" in the last 8760 hours. HbA1C: No results for input(s): "HGBA1C" in the last 72 hours. CBG: No results for input(s): "GLUCAP" in the last 168 hours. Lipid Profile: No results for input(s): "CHOL", "HDL", "LDLCALC", "TRIG", "CHOLHDL", "LDLDIRECT" in the last 72 hours. Thyroid Function Tests: No results for input(s): "TSH", "T4TOTAL", "FREET4", "T3FREE", "THYROIDAB" in the last 72 hours. Anemia Panel: No results for input(s): "VITAMINB12", "FOLATE", "FERRITIN", "TIBC", "IRON", "RETICCTPCT" in the last 72 hours. Urine analysis: No results found for: "COLORURINE", "APPEARANCEUR", "LABSPEC", "PHURINE", "GLUCOSEU", "HGBUR", "BILIRUBINUR", "KETONESUR", "PROTEINUR", "UROBILINOGEN", "NITRITE", "LEUKOCYTESUR"  Radiological Exams on Admission: DG Chest Portable 1 View  Result Date: 11/25/2022 CLINICAL DATA:  Hip fracture. Medical clearance for surgical intervention. EXAM: PORTABLE CHEST 1 VIEW COMPARISON:  None Available. FINDINGS: The heart size and mediastinal contours are within normal limits. Both lungs are clear. The visualized skeletal structures are unremarkable. IMPRESSION: No active disease. Electronically Signed   By: Helyn Numbers M.D.   On: 11/25/2022 21:53   CT Hip Left Wo Contrast  Result Date: 11/25/2022 CLINICAL DATA:  Pain after fall EXAM: CT OF THE LEFT HIP WITHOUT CONTRAST TECHNIQUE: Multidetector CT imaging of the left hip was performed according to the standard protocol. Multiplanar CT image reconstructions were also generated. RADIATION DOSE REDUCTION: This exam was performed according to the departmental dose-optimization program which includes automated exposure control, adjustment of the mA and/or kV according to patient  size and/or use of iterative reconstruction technique. COMPARISON:  No prior CT exam FINDINGS: Bones/Joint/Cartilage There is a minimally angulated nondisplaced, slightly impacted subcapital femoral neck fracture of the left hip. No additional fracture or dislocation. Degenerative changes seen of the sacroiliac joint at the edge of the imaging field. Slight concentric joint space loss of the left hip. Few subtle areas of sclerosis identified along the left iliac bone uncertain etiology and significance. Ligaments Suboptimally assessed by CT. Muscles and Tendons Mild muscle atrophy. Soft tissues Scattered vascular calcifications. Visualized pelvis is preserved without clear lymph node enlargement, free fluid. Prominent prostate with mass effect along the base of the bladder. IMPRESSION: Impacted minimally angulated subcapital femoral neck fracture on the left. Multifocal degenerative changes Electronically Signed   By: Karen Kays M.D.   On: 11/25/2022 18:19     Data Reviewed: Relevant notes from primary care and specialist visits, past discharge summaries as available in EHR, including Care Everywhere. Prior diagnostic testing as pertinent to current admission diagnoses Updated medications and problem lists for reconciliation ED course, including vitals, labs, imaging, treatment and response to treatment Triage notes, nursing and pharmacy notes and ED provider's notes  Notable results as noted in HPI   Assessment and Plan: * Closed displaced fracture of left femoral neck (HCC) Accidental fall Pain control Further orders per Ortho N.p.o. for orthopedic repair on 11/26/2022 Patient at low risk of perioperative cardiopulmonary complications  CAD S/P percutaneous coronary angioplasty Patient with history of stent in 2015 Denies chest pain or shortness of breath Holding aspirin for surgery Continue atorvastatin 40 mg daily, Toprol 100 mg daily and losartan 100 mg daily  Chronic use of opiate  drug for therapeutic purpose Chronic back pain with history of cervical and lumbar fusion Currently on Butrans patch which we will continue Morphine for breakthrough pain Avoid NSAIDs due to renal function  Type 2 diabetes mellitus with diabetic chronic kidney disease (HCC) Sliding scale insulin coverage Continue Jardiance 10 mg daily  CKD (chronic kidney disease) stage 4, GFR 15-29 ml/min (HCC) Renal function at baseline.  Essential hypertension Continue amlodipine 5 mg daily Hydralazine IV as needed while n.p.o.  Hypothyroidism Continue levothyroxine 200 mg daily  Benign prostatic hyperplasia Continue finasteride 5 mg daily  Chronic gouty arthritis Continue allopurinol 100 mg daily        DVT prophylaxis: SCD  Consults: Orthopedic Dr. Signa Kell  Advance Care Planning: full code  Family Communication: none  Disposition Plan: Back to previous home environment  Severity of Illness: The appropriate patient status for this patient is INPATIENT. Inpatient status is judged to be reasonable and necessary in order to provide the required intensity of service to ensure the patient's safety. The patient's presenting symptoms, physical exam findings, and initial radiographic and laboratory data in the context of their chronic comorbidities is felt to place them at high risk for further clinical deterioration. Furthermore, it is not anticipated that the patient will be medically stable for discharge from the hospital within 2 midnights of admission.   * I certify that at the point of admission it is my clinical judgment that the patient will require inpatient hospital care spanning beyond 2 midnights from the point of admission due to high intensity of service, high risk for further deterioration and high frequency of surveillance required.*  Author: Andris Baumann, MD 11/25/2022 10:47 PM  For on call review [email or web address].

## 2022-11-25 NOTE — Assessment & Plan Note (Addendum)
Creatinine 2.65 with a GFR of 23

## 2022-11-25 NOTE — Assessment & Plan Note (Addendum)
Continue levothyroxine

## 2022-11-25 NOTE — Assessment & Plan Note (Addendum)
-  Continue finasteride

## 2022-11-25 NOTE — Assessment & Plan Note (Addendum)
Accidental fall Postoperative day 1.  Did well with physical therapy this morning and again this afternoon.  Wants to go home with home health.  Few pills of oxycodone prescribed but patient likely will not take them.  Likely will stick with Tylenol.  Orthopedic surgery recommended aspirin twice daily for DVT prophylaxis.

## 2022-11-25 NOTE — Assessment & Plan Note (Addendum)
Sliding scale insulin coverage.  Hemoglobin A1c good at 6.2.  On Jardiance.

## 2022-11-25 NOTE — ED Provider Notes (Signed)
[facility] Provider Note    Event Date/Time   First MD Initiated Contact with Patient 11/25/22 2026     (approximate)   History   Hip Pain   HPI  Alec White is a 82 y.o. male who presents to the emergency department today because of concerns for left hip pain.  Patient had a fall about a week ago onto that left hip.  Had x-rays performed at that time which were negative.  Since then however he has continued to have pain.  He has been using a walker to ambulate.  He will only put 1 to 2% of his body weight on that leg when using the walker. Denies any numbness in the leg.      Physical Exam   Triage Vital Signs: ED Triage Vitals  Encounter Vitals Group     BP 11/25/22 1541 (!) 162/84     Systolic BP Percentile --      Diastolic BP Percentile --      Pulse Rate 11/25/22 1541 78     Resp 11/25/22 1541 20     Temp 11/25/22 1541 97.9 F (36.6 C)     Temp src --      SpO2 11/25/22 1541 98 %     Weight 11/25/22 1542 192 lb (87.1 kg)     Height 11/25/22 1542 5\' 7"  (1.702 m)     Head Circumference --      Peak Flow --      Pain Score 11/25/22 1541 10     Pain Loc --      Pain Education --      Exclude from Growth Chart --     Most recent vital signs: Vitals:   11/25/22 1541  BP: (!) 162/84  Pulse: 78  Resp: 20  Temp: 97.9 F (36.6 C)  SpO2: 98%   General: Awake, alert, oriented. CV:  Good peripheral perfusion. Regular rate and rhythm. Resp:  Normal effort. Lungs clear. Abd:  No distention.  Other:  Left hip with tenderness.   ED Results / Procedures / Treatments   Labs (all labs ordered are listed, but only abnormal results are displayed) Labs Reviewed  CBC WITH DIFFERENTIAL/PLATELET - Abnormal; Notable for the following components:      Result Value   Platelets 122 (*)    All other components within normal limits  BASIC METABOLIC PANEL - Abnormal; Notable for the following components:   Glucose, Bld 126 (*)    BUN 46 (*)     Creatinine, Ser 2.45 (*)    GFR, Estimated 26 (*)    All other components within normal limits  PROTIME-INR     EKG  I, Phineas Semen, attending physician, personally viewed and interpreted this EKG  EKG Time: 2206 Rate: 74 Rhythm: sinus rhythm Axis: normal Intervals: qtc 432 QRS: narrow, q waves v1, III ST changes: no st elevation Impression: abnormal ekg   RADIOLOGY I independently interpreted and visualized the left hip CT. My interpretation: hip fracture Radiology interpretation:  IMPRESSION:  Impacted minimally angulated subcapital femoral neck fracture on the  left.    Multifocal degenerative changes      PROCEDURES:  Critical Care performed: No   MEDICATIONS ORDERED IN ED: Medications - No data to display   IMPRESSION / MDM / ASSESSMENT AND PLAN / ED COURSE  I reviewed the triage vital signs and the nursing notes.  Differential diagnosis includes, but is not limited to, hip fracture, contusion  Patient's presentation is most consistent with acute presentation with potential threat to life or bodily function.  Patient presents to the emergency department today because of concerns for continued left hip pain.  Patient had negative x-ray about a week ago.  CT scan was obtained here which is consistent with hip fracture.  Discussed with Dr. Allena Katz with the orthopedic surgery service.  Additionally discussed with Dr. Para March with the hospitalist service who will evaluate for admission.     FINAL CLINICAL IMPRESSION(S) / ED DIAGNOSES   Final diagnoses:  Closed fracture of left hip, initial encounter [facility])        Note:  This document was prepared using Dragon voice recognition software and may include unintentional dictation errors.    Phineas Semen, MD 11/25/22 2249

## 2022-11-25 NOTE — Assessment & Plan Note (Addendum)
Continue Toprol Norvasc and losartan

## 2022-11-25 NOTE — Assessment & Plan Note (Addendum)
Patient with history of stent in 2015 Continue atorvastatin, Toprol aspirin and losartan.

## 2022-11-26 ENCOUNTER — Encounter
Admission: EM | Disposition: A | Discharge status: Home Health | Payer: Self-pay | Source: Ambulatory Visit | Attending: Internal Medicine

## 2022-11-26 ENCOUNTER — Inpatient Hospital Stay: Payer: Medicare Other | Admitting: Anesthesiology

## 2022-11-26 ENCOUNTER — Other Ambulatory Visit: Payer: Self-pay

## 2022-11-26 ENCOUNTER — Encounter: Payer: Self-pay | Admitting: Internal Medicine

## 2022-11-26 ENCOUNTER — Inpatient Hospital Stay: Payer: Medicare Other

## 2022-11-26 DIAGNOSIS — N184 Chronic kidney disease, stage 4 (severe): Secondary | ICD-10-CM

## 2022-11-26 DIAGNOSIS — S72002A Fracture of unspecified part of neck of left femur, initial encounter for closed fracture: Secondary | ICD-10-CM | POA: Diagnosis not present

## 2022-11-26 DIAGNOSIS — N400 Enlarged prostate without lower urinary tract symptoms: Secondary | ICD-10-CM

## 2022-11-26 DIAGNOSIS — I2510 Atherosclerotic heart disease of native coronary artery without angina pectoris: Secondary | ICD-10-CM | POA: Diagnosis not present

## 2022-11-26 DIAGNOSIS — E782 Mixed hyperlipidemia: Secondary | ICD-10-CM

## 2022-11-26 DIAGNOSIS — E039 Hypothyroidism, unspecified: Secondary | ICD-10-CM

## 2022-11-26 DIAGNOSIS — M1A00X0 Idiopathic chronic gout, unspecified site, without tophus (tophi): Secondary | ICD-10-CM

## 2022-11-26 DIAGNOSIS — I10 Essential (primary) hypertension: Secondary | ICD-10-CM

## 2022-11-26 DIAGNOSIS — Z79891 Long term (current) use of opiate analgesic: Secondary | ICD-10-CM | POA: Diagnosis not present

## 2022-11-26 DIAGNOSIS — E1122 Type 2 diabetes mellitus with diabetic chronic kidney disease: Secondary | ICD-10-CM

## 2022-11-26 DIAGNOSIS — Z9861 Coronary angioplasty status: Secondary | ICD-10-CM

## 2022-11-26 HISTORY — PX: HIP PINNING,CANNULATED: SHX1758

## 2022-11-26 LAB — GLUCOSE, CAPILLARY
Glucose-Capillary: 102 mg/dL — ABNORMAL HIGH (ref 70–99)
Glucose-Capillary: 111 mg/dL — ABNORMAL HIGH (ref 70–99)
Glucose-Capillary: 118 mg/dL — ABNORMAL HIGH (ref 70–99)
Glucose-Capillary: 123 mg/dL — ABNORMAL HIGH (ref 70–99)
Glucose-Capillary: 134 mg/dL — ABNORMAL HIGH (ref 70–99)
Glucose-Capillary: 82 mg/dL (ref 70–99)
Glucose-Capillary: 91 mg/dL (ref 70–99)

## 2022-11-26 LAB — HEMOGLOBIN A1C
Hgb A1c MFr Bld: 6.2 % — ABNORMAL HIGH (ref 4.8–5.6)
Mean Plasma Glucose: 131.24 mg/dL

## 2022-11-26 LAB — SURGICAL PCR SCREEN
MRSA, PCR: NEGATIVE
Staphylococcus aureus: NEGATIVE

## 2022-11-26 SURGERY — FIXATION, FEMUR, NECK, PERCUTANEOUS, USING SCREW
Anesthesia: Spinal | Site: Hip | Laterality: Left

## 2022-11-26 MED ORDER — BUPIVACAINE LIPOSOME 1.3 % IJ SUSP
INTRAMUSCULAR | Status: DC | PRN
  Administered 2022-11-26: 30 mL

## 2022-11-26 MED ORDER — ENSURE ENLIVE PO LIQD: 237.0000 mL | Freq: Two times a day (BID) | ORAL | Status: DC

## 2022-11-26 MED ORDER — ONDANSETRON HCL 4 MG PO TABS: 4.0000 mg | ORAL_TABLET | Freq: Four times a day (QID) | ORAL | Status: DC | PRN

## 2022-11-26 MED ORDER — PROPOFOL 1000 MG/100ML IV EMUL
INTRAVENOUS | Status: AC
  Filled 2022-11-26: qty 100

## 2022-11-26 MED ORDER — MIDAZOLAM HCL 2 MG/2ML IJ SOLN
INTRAMUSCULAR | Status: DC | PRN
Start: 2022-11-26 — End: 2022-11-26
  Administered 2022-11-26: .5 mg via INTRAVENOUS

## 2022-11-26 MED ORDER — DEXTROSE 50 % IV SOLN
25.0000 mL | Freq: Once | INTRAVENOUS | Status: AC
  Administered 2022-11-26: 25 mL via INTRAVENOUS

## 2022-11-26 MED ORDER — FLEET ENEMA RE ENEM: 1.0000 | ENEMA | Freq: Once | RECTAL | Status: DC | PRN

## 2022-11-26 MED ORDER — PROPOFOL 500 MG/50ML IV EMUL
INTRAVENOUS | Status: DC | PRN
  Administered 2022-11-26: 75 ug/kg/min via INTRAVENOUS

## 2022-11-26 MED ORDER — OXYCODONE HCL 5 MG PO TABS: 2.5000 mg | ORAL_TABLET | ORAL | Status: DC | PRN

## 2022-11-26 MED ORDER — METOCLOPRAMIDE HCL 5 MG/ML IJ SOLN: 5.0000 mg | Freq: Three times a day (TID) | INTRAMUSCULAR | Status: DC | PRN

## 2022-11-26 MED ORDER — OXYCODONE HCL 5 MG PO TABS
ORAL_TABLET | ORAL | Status: AC
  Filled 2022-11-26: qty 1

## 2022-11-26 MED ORDER — ACETAMINOPHEN 325 MG PO TABS: 325.0000 mg | ORAL_TABLET | Freq: Four times a day (QID) | ORAL | Status: DC | PRN

## 2022-11-26 MED ORDER — OXYCODONE HCL 5 MG PO TABS: 5.0000 mg | ORAL_TABLET | ORAL | Status: DC | PRN

## 2022-11-26 MED ORDER — ONDANSETRON HCL 4 MG/2ML IJ SOLN: 4.0000 mg | Freq: Four times a day (QID) | INTRAMUSCULAR | Status: DC | PRN

## 2022-11-26 MED ORDER — SODIUM CHLORIDE 0.9 % IR SOLN
Status: DC | PRN
  Administered 2022-11-26: 500 mL

## 2022-11-26 MED ORDER — LIDOCAINE HCL (PF) 2 % IJ SOLN
INTRAMUSCULAR | Status: AC
  Filled 2022-11-26: qty 5

## 2022-11-26 MED ORDER — KETAMINE HCL 50 MG/5ML IJ SOSY
PREFILLED_SYRINGE | INTRAMUSCULAR | Status: DC | PRN
Start: 2022-11-26 — End: 2022-11-26
  Administered 2022-11-26 (×2): 10 mg via INTRAVENOUS
  Administered 2022-11-26: 20 mg via INTRAVENOUS

## 2022-11-26 MED ORDER — DOCUSATE SODIUM 100 MG PO CAPS
100.0000 mg | ORAL_CAPSULE | Freq: Two times a day (BID) | ORAL | Status: DC
  Administered 2022-11-26 – 2022-11-27 (×2): 100 mg via ORAL
  Filled 2022-11-26 (×2): qty 1

## 2022-11-26 MED ORDER — METOCLOPRAMIDE HCL 5 MG PO TABS: 5.0000 mg | ORAL_TABLET | Freq: Three times a day (TID) | ORAL | Status: DC | PRN

## 2022-11-26 MED ORDER — ACETAMINOPHEN 10 MG/ML IV SOLN: 1000.0000 mg | Freq: Once | INTRAVENOUS | Status: DC | PRN

## 2022-11-26 MED ORDER — FENTANYL CITRATE (PF) 100 MCG/2ML IJ SOLN
25.0000 ug | INTRAMUSCULAR | Status: DC | PRN
  Administered 2022-11-26 (×4): 25 ug via INTRAVENOUS

## 2022-11-26 MED ORDER — FENTANYL CITRATE (PF) 100 MCG/2ML IJ SOLN
INTRAMUSCULAR | Status: AC
  Filled 2022-11-26: qty 2

## 2022-11-26 MED ORDER — BUPIVACAINE HCL (PF) 0.5 % IJ SOLN
INTRAMUSCULAR | Status: AC
  Filled 2022-11-26: qty 10

## 2022-11-26 MED ORDER — OXYCODONE HCL 5 MG PO TABS
5.0000 mg | ORAL_TABLET | Freq: Once | ORAL | Status: AC | PRN
  Administered 2022-11-26: 5 mg via ORAL

## 2022-11-26 MED ORDER — FENTANYL CITRATE (PF) 100 MCG/2ML IJ SOLN
INTRAMUSCULAR | Status: DC | PRN
  Administered 2022-11-26: 50 ug via INTRAVENOUS

## 2022-11-26 MED ORDER — SODIUM CHLORIDE 0.9 % IV SOLN
INTRAVENOUS | Status: DC | PRN
Start: 2022-11-26 — End: 2022-11-26

## 2022-11-26 MED ORDER — NEOMYCIN-POLYMYXIN B GU 40-200000 IR SOLN
Status: AC
  Filled 2022-11-26: qty 2

## 2022-11-26 MED ORDER — PROPOFOL 10 MG/ML IV BOLUS
INTRAVENOUS | Status: DC | PRN
  Administered 2022-11-26 (×3): 20 mg via INTRAVENOUS

## 2022-11-26 MED ORDER — BISACODYL 10 MG RE SUPP: 10.0000 mg | Freq: Every day | RECTAL | Status: DC | PRN

## 2022-11-26 MED ORDER — DROPERIDOL 2.5 MG/ML IJ SOLN: 0.6250 mg | Freq: Once | INTRAMUSCULAR | Status: DC | PRN

## 2022-11-26 MED ORDER — BUPIVACAINE HCL (PF) 0.5 % IJ SOLN
INTRAMUSCULAR | Status: AC
  Filled 2022-11-26: qty 30

## 2022-11-26 MED ORDER — HYDROMORPHONE HCL 1 MG/ML IJ SOLN
0.2000 mg | INTRAMUSCULAR | Status: DC | PRN
  Administered 2022-11-26: 0.4 mg via INTRAVENOUS
  Filled 2022-11-26: qty 0.5

## 2022-11-26 MED ORDER — CEFAZOLIN SODIUM-DEXTROSE 2-4 GM/100ML-% IV SOLN
2.0000 g | Freq: Four times a day (QID) | INTRAVENOUS | Status: AC
  Administered 2022-11-26 – 2022-11-27 (×3): 2 g via INTRAVENOUS
  Filled 2022-11-26 (×3): qty 100

## 2022-11-26 MED ORDER — SENNOSIDES-DOCUSATE SODIUM 8.6-50 MG PO TABS: 1.0000 | ORAL_TABLET | Freq: Every evening | ORAL | Status: DC | PRN

## 2022-11-26 MED ORDER — ADULT MULTIVITAMIN W/MINERALS CH
1.0000 | ORAL_TABLET | Freq: Every day | ORAL | Status: DC
  Administered 2022-11-27: 1 via ORAL
  Filled 2022-11-26: qty 1

## 2022-11-26 MED ORDER — ENOXAPARIN SODIUM 30 MG/0.3ML IJ SOSY
30.0000 mg | PREFILLED_SYRINGE | INTRAMUSCULAR | Status: DC
  Administered 2022-11-27: 30 mg via SUBCUTANEOUS
  Filled 2022-11-26: qty 0.3

## 2022-11-26 MED ORDER — METHOCARBAMOL 1000 MG/10ML IJ SOLN: 500.0000 mg | Freq: Four times a day (QID) | INTRAVENOUS | Status: DC | PRN

## 2022-11-26 MED ORDER — KETAMINE HCL 50 MG/5ML IJ SOSY
PREFILLED_SYRINGE | INTRAMUSCULAR | Status: AC
  Filled 2022-11-26: qty 5

## 2022-11-26 MED ORDER — METHOCARBAMOL 500 MG PO TABS: 500.0000 mg | ORAL_TABLET | Freq: Four times a day (QID) | ORAL | Status: DC | PRN

## 2022-11-26 MED ORDER — ACETAMINOPHEN 500 MG PO TABS
1000.0000 mg | ORAL_TABLET | Freq: Three times a day (TID) | ORAL | Status: DC
  Administered 2022-11-26 – 2022-11-27 (×3): 1000 mg via ORAL
  Filled 2022-11-26 (×3): qty 2

## 2022-11-26 MED ORDER — CEFAZOLIN SODIUM-DEXTROSE 2-3 GM-%(50ML) IV SOLR
INTRAVENOUS | Status: DC | PRN
Start: 2022-11-26 — End: 2022-11-26
  Administered 2022-11-26: 2 g via INTRAVENOUS

## 2022-11-26 MED ORDER — DEXMEDETOMIDINE HCL IN NACL 80 MCG/20ML IV SOLN
INTRAVENOUS | Status: DC | PRN
Start: 2022-11-26 — End: 2022-11-26
  Administered 2022-11-26: 8 ug via INTRAVENOUS

## 2022-11-26 MED ORDER — MIDAZOLAM HCL 2 MG/2ML IJ SOLN
INTRAMUSCULAR | Status: AC
  Filled 2022-11-26: qty 2

## 2022-11-26 MED ORDER — TIMOLOL MALEATE 0.5 % OP SOLN
1.0000 [drp] | Freq: Every day | OPHTHALMIC | Status: DC
  Administered 2022-11-27: 1 [drp] via OPHTHALMIC
  Filled 2022-11-26: qty 5

## 2022-11-26 MED ORDER — TRAMADOL HCL 50 MG PO TABS: 50.0000 mg | ORAL_TABLET | Freq: Four times a day (QID) | ORAL | Status: DC | PRN

## 2022-11-26 MED ORDER — OXYCODONE HCL 5 MG/5ML PO SOLN: 5.0000 mg | Freq: Once | ORAL | Status: AC | PRN

## 2022-11-26 MED ORDER — SALINE SPRAY 0.65 % NA SOLN
1.0000 | NASAL | Status: DC | PRN
  Filled 2022-11-26: qty 44

## 2022-11-26 MED ORDER — KETOROLAC TROMETHAMINE 15 MG/ML IJ SOLN
7.5000 mg | Freq: Four times a day (QID) | INTRAMUSCULAR | Status: AC
  Administered 2022-11-26 – 2022-11-27 (×4): 7.5 mg via INTRAVENOUS
  Filled 2022-11-26 (×4): qty 1

## 2022-11-26 MED ORDER — DEXTROSE 50 % IV SOLN
INTRAVENOUS | Status: AC
  Filled 2022-11-26: qty 50

## 2022-11-26 SURGICAL SUPPLY — 44 items
APL PRP STRL LF DISP 70% ISPRP (MISCELLANEOUS) ×1
BIT DRILL 4.9 CANNULATED (BIT) ×1
BIT DRILL CANN QC 4.9 LRG (BIT) IMPLANT
BLADE SURG 15 STRL LF DISP TIS (BLADE) ×1 IMPLANT
BLADE SURG 15 STRL SS (BLADE) ×1
CHLORAPREP W/TINT 26 (MISCELLANEOUS) ×1 IMPLANT
DRAPE SHEET LG 3/4 BI-LAMINATE (DRAPES) ×1 IMPLANT
DRAPE SURG 17X11 SM STRL (DRAPES) ×2 IMPLANT
DRAPE U-SHAPE 47X51 STRL (DRAPES) ×2 IMPLANT
DRILL BIT CANNULATED 4.9 (BIT) ×1
DRSG OPSITE POSTOP 3X4 (GAUZE/BANDAGES/DRESSINGS) ×1 IMPLANT
ELECT REM PT RETURN 9FT ADLT (ELECTROSURGICAL) ×1
ELECTRODE REM PT RTRN 9FT ADLT (ELECTROSURGICAL) ×1 IMPLANT
GAUZE XEROFORM 1X8 LF (GAUZE/BANDAGES/DRESSINGS) ×1 IMPLANT
GLOVE BIOGEL PI IND STRL 8 (GLOVE) ×1 IMPLANT
GLOVE SURG SYN 7.5 E (GLOVE) ×2 IMPLANT
GLOVE SURG SYN 7.5 PF PI (GLOVE) ×2 IMPLANT
GOWN STRL REUS W/ TWL LRG LVL3 (GOWN DISPOSABLE) ×1 IMPLANT
GOWN STRL REUS W/ TWL XL LVL3 (GOWN DISPOSABLE) ×1 IMPLANT
GOWN STRL REUS W/TWL LRG LVL3 (GOWN DISPOSABLE) ×1
GOWN STRL REUS W/TWL XL LVL3 (GOWN DISPOSABLE) ×1
GUIDEWIRE ASNIS 3.2 NONCAL (WIRE) IMPLANT
KIT TURNOVER CYSTO (KITS) ×1 IMPLANT
MANIFOLD NEPTUNE II (INSTRUMENTS) ×1 IMPLANT
MAT ABSORB FLUID 56X50 GRAY (MISCELLANEOUS) ×2 IMPLANT
NDL FILTER BLUNT 18X1 1/2 (NEEDLE) ×1 IMPLANT
NDL HYPO 22X1.5 SAFETY MO (MISCELLANEOUS) ×1 IMPLANT
NEEDLE FILTER BLUNT 18X1 1/2 (NEEDLE) ×1 IMPLANT
NEEDLE HYPO 22X1.5 SAFETY MO (MISCELLANEOUS) ×1 IMPLANT
NS IRRIG 500ML POUR BTL (IV SOLUTION) ×1 IMPLANT
PACK HIP COMPR (MISCELLANEOUS) ×1 IMPLANT
SCREW ASNIS 90MM (Screw) IMPLANT
SCREW CANN ASNIS 6.5X95 (Screw) IMPLANT
STAPLER SKIN PROX 35W (STAPLE) ×1 IMPLANT
SUT VIC AB 0 CT1 36 (SUTURE) ×1 IMPLANT
SUT VIC AB 2-0 CT1 27 (SUTURE) ×1
SUT VIC AB 2-0 CT1 TAPERPNT 27 (SUTURE) ×1 IMPLANT
SYR 30ML LL (SYRINGE) ×1 IMPLANT
SYR 5ML LL (SYRINGE) ×1 IMPLANT
TAPE CLOTH 3X10 WHT NS LF (GAUZE/BANDAGES/DRESSINGS) ×1 IMPLANT
TRAP FLUID SMOKE EVACUATOR (MISCELLANEOUS) ×1 IMPLANT
WASHER (Washer) ×1 IMPLANT
WASHER ORTH CANN SS SCR (Washer) IMPLANT
WATER STERILE IRR 500ML POUR (IV SOLUTION) ×1 IMPLANT

## 2022-11-26 NOTE — Assessment & Plan Note (Signed)
On atorvastatin

## 2022-11-26 NOTE — Anesthesia Preprocedure Evaluation (Addendum)
Anesthesia Evaluation  Patient identified by MRN, date of birth, ID band Patient awake    Reviewed: Allergy & Precautions, H&P , NPO status , Patient's Chart, lab work & pertinent test results, reviewed documented beta blocker date and time   Airway Mallampati: II   Neck ROM: full    Dental  (+) Poor Dentition   Pulmonary neg pulmonary ROS   Pulmonary exam normal        Cardiovascular Exercise Tolerance: Poor hypertension, On Medications + CAD  negative cardio ROS Normal cardiovascular exam Rhythm:regular Rate:Normal     Neuro/Psych negative neurological ROS  negative psych ROS   GI/Hepatic negative GI ROS, Neg liver ROS,,,  Endo/Other  negative endocrine ROSdiabetesHypothyroidism    Renal/GU Renal diseasenegative Renal ROS  negative genitourinary   Musculoskeletal   Abdominal   Peds  Hematology negative hematology ROS (+)   Anesthesia Other Findings History reviewed. No pertinent past medical history. Past Surgical History: 08/31/2017: spinal neurostimulator BMI    Body Mass Index: 30.07 kg/m     Reproductive/Obstetrics negative OB ROS                             Anesthesia Physical Anesthesia Plan  ASA: 3 and emergent  Anesthesia Plan: Spinal   Post-op Pain Management:    Induction:   PONV Risk Score and Plan:   Airway Management Planned:   Additional Equipment:   Intra-op Plan:   Post-operative Plan:   Informed Consent: I have reviewed the patients History and Physical, chart, labs and discussed the procedure including the risks, benefits and alternatives for the proposed anesthesia with the patient or authorized representative who has indicated his/her understanding and acceptance.     Dental Advisory Given  Plan Discussed with: CRNA  Anesthesia Plan Comments:        Anesthesia Quick Evaluation

## 2022-11-26 NOTE — Hospital Course (Signed)
Current medication timolol, Butrans patch, allopurinol 100 mg daily, amlodipine 5 mg daily, aspirin 81 mg daily, atorvastatin 20 mg, 40 mg daily, Jardiance 10 mg daily, finasteride 5 mg daily, levothyroxine 200 mg daily, Claritin 10 mg daily, losartan 100 mg daily Toprol 100 mg daily patient with history of stent in 2015

## 2022-11-26 NOTE — Anesthesia Procedure Notes (Addendum)
Spinal  Patient location during procedure: OR Start time: 11/26/2022 1:07 PM End time: 11/26/2022 1:17 PM Reason for block: surgical anesthesia Staffing Performed: anesthesiologist and resident/CRNA  Anesthesiologist: Yevette Edwards, MD Resident/CRNA: Stormy Fabian, CRNA Performed by: Stormy Fabian, CRNA Authorized by: Yevette Edwards, MD   Preanesthetic Checklist Completed: patient identified, IV checked, site marked, risks and benefits discussed, surgical consent, monitors and equipment checked, pre-op evaluation and timeout performed Spinal Block Patient position: sitting Prep: ChloraPrep Patient monitoring: heart rate, continuous pulse ox, blood pressure and cardiac monitor Approach: midline Location: L3-4 Injection technique: single-shot Needle Needle type: Whitacre and Introducer  Needle gauge: 24 G Needle length: 9 cm Assessment Sensory level: T10 Events: CSF return Additional Notes Sterile aseptic technique used throughout the procedure.  Negative paresthesia. Negative blood return.  free-flowing yellow serous fluid noted through the spinal needle. Expiration date of kit checked and confirmed. Patient tolerated procedure well, without complications.Spinal anesthetic unsuccessful x 2 attempts. Patient denies any numbness at present of lower extremities

## 2022-11-26 NOTE — Op Note (Signed)
DATE OF SURGERY: 11/26/2022  PREOPERATIVE DIAGNOSIS: Left valgus impacted femoral neck fracture  POSTOPERATIVE DIAGNOSIS: Left valgus impacted femoral neck fracture  PROCEDURE: Percutaneous pinning of Left femoral neck fracture  SURGEON: Rosealee Albee, MD  ASSISTANTS: none  EBL: 5 cc  COMPONENTS:  Stryker 6.77mm cannulated screws x 3 with washers (95mm, 90mm, 90mm)   INDICATIONS: Alec White is a 82 y.o. male who sustained a valgus impacted femoral neck fracture after a fall. Risks and benefits of percutaneous pinning were explained to the patient and family. Risks include but are not limited to bleeding, infection, injury to tissues, nerves, vessels, DVT/PE, malunion/nonunion, hardware failure, and risks of anesthesia. The patient and family understand these risks, have completed an informed consent, and wish to proceed.   PROCEDURE:  The patient was brought into the operating room. After administering spinal anesthesia, the patient was placed in the supine position on the Hana table. The uninjured leg was extended while the injured lower extremity was placed in a neutral position with care taken to not displace the fracture during positioning. The lateral aspects of the operative hip and thigh were prepped with ChloraPrep solution before being draped sterilely. IV antibiotics were administered. A timeout was performed to verify the appropriate surgical site, patient, and procedure.   The greater trochanter was identified and an approximately 5 cm incision was made over the lateral aspect of the proximal femur. The incision was carried down through the subcutaneous tissues to expose the IT band. This was split at the proximal portion of the incision and the vastus lateralis was split in line with its fibers. The lateral aspect of the femur was cleared of soft tissue. Under fluoroscopic guidance, a guidewire was placed along the inferior aspect of the femoral neck into the head while  ensuring the start point on the lateral cortex was not below the level of the lesser trochanter. A parallel guide was used to place two additional guidepins (superior posterior and superior anterior). Position of all pins was verified fluoroscopically in both the AP and lateral views. A measuring device was used the measure appropriate screw length. The guidepins were drilled with a 3.71mm drill. Appropriately sized screws were advanced starting with the inferior screw, then superior posterior, then superior anterior. Screws were sequentially tightened. Hardware position and bony alignment was confirmed fluoroscopically with AP and lateral views.   The wounds were irrigated thoroughly with sterile saline solution. The IT band was closed with 0-Vicryl. The subcutaneous tissues were closed using 2-0 Vicryl interrupted sutures. The skin was closed using staples. Sterile occlusive dressing was applied. The patient was then transferred to the recovery room in satisfactory condition after tolerating the procedure well.  POSTOPERATIVE PLAN: The patient will be WBAT on the operative extremity. Lovenox x 4 weeks for DVPT ppx to start on POD#1. Ancef x 24 hours. PT/OT on POD#1.

## 2022-11-26 NOTE — Progress Notes (Signed)
Initial Nutrition Assessment  DOCUMENTATION CODES:   Obesity unspecified  INTERVENTION:   -Once diet is advanced, add:   -Ensure Enlive po BID, each supplement provides 350 kcal and 20 grams of protein.  -MVI with minerals daily  NUTRITION DIAGNOSIS:   Increased nutrient needs related to post-op healing as evidenced by estimated needs.  GOAL:   Patient will meet greater than or equal to 90% of their needs  MONITOR:   PO intake, Supplement acceptance, Diet advancement  REASON FOR ASSESSMENT:   Consult Assessment of nutrition requirement/status, Hip fracture protocol  ASSESSMENT:   Pt with medical history significant for Hypertension, hyperlipidemia, CKD 4, hypothyroidism chronic back pain s/p cervical and lumbar fusion on chronic opioids/Butrans patch, non-insulin-dependent type 2 diabetes, BPH, CAD, who was admitted for evaluation for hip fracture.  Pt admitted with lt femoral neck fracture.   Reviewed I/O's: -200 ml x 24 hours  UOP: 200 ml x 24 hours   Per orthopedics notes, plan for surgery today. Pt is NPO in anticipation of procedure.   Spoke with pt at bedside, who was pleasant and in good spirits today. Pt reports feeling well rested and is eager for surgery today. Pt shares that he has a good appetite, but "doesn't eat as much as I used to". He typically eats 2-3 meals per day (Breakfast: cereal with fruit; Lunch: half sandwich, Dinner: meat, starch, and vegetable).   Pt shares he has lost about 20# over the past few years. He explains that he has had multiple back surgeries over the past few years and is in constant pain. This has also impacted his mobility and noted multiple recent falls. Weight loss has tapered off and "I'm not trying to gain any back". Pt estimates he has lost about 4# this year.   Discussed importance of good meal and supplement intake to promote healing. Pt reports he drinks a protein shake "on and off" PTA.   Medications reviewed.   Lab  Results  Component Value Date   HGBA1C 6.2 (H) 11/25/2022   PTA DM medications are 10 mg jardiance daily.   Labs reviewed: CBGS: 102-111 (inpatient orders for glycemic control are 0-15 units insulin aspart every 4 hours and 10 mg jardiance daily).    NUTRITION - FOCUSED PHYSICAL EXAM:  Flowsheet Row Most Recent Value  Orbital Region No depletion  Upper Arm Region No depletion  Thoracic and Lumbar Region No depletion  Buccal Region No depletion  Temple Region No depletion  Clavicle Bone Region No depletion  Clavicle and Acromion Bone Region No depletion  Scapular Bone Region No depletion  Dorsal Hand No depletion  Patellar Region No depletion  Anterior Thigh Region No depletion  Posterior Calf Region No depletion  Edema (RD Assessment) None  Hair Reviewed  Eyes Reviewed  Mouth Reviewed  Skin Reviewed  Nails Reviewed       Diet Order:   Diet Order             Diet NPO time specified  Diet effective midnight                   EDUCATION NEEDS:   Education needs have been addressed  Skin:  Skin Assessment: Reviewed RN Assessment  Last BM:  11/25/22  Height:   Ht Readings from Last 1 Encounters:  11/25/22 5\' 7"  (1.702 m)    Weight:   Wt Readings from Last 1 Encounters:  11/25/22 87.1 kg    Ideal Body Weight:  67.3 kg  BMI:  Body mass index is 30.07 kg/m.  Estimated Nutritional Needs:   Kcal:  1700-1900  Protein:  85-100 grams  Fluid:  > 1.7 L    Levada Schilling, RD, LDN, CDCES Registered Dietitian III Certified Diabetes Care and Education Specialist Please refer to [facility] for RD and/or RD on-call/weekend/after hours pager

## 2022-11-26 NOTE — Progress Notes (Signed)
Progress Note   Patient: Alec White:[id number] DOB: 10/27/1940 DOA: 11/25/2022     1 DOS: the patient was seen and examined on 11/26/2022   Brief hospital course: 82 year old man with past medical history of hypertension, hyperlipidemia, CKD stage IV, hypothyroidism, chronic back pain status post multiple operations and stimulator, non-insulin-dependent type 2 diabetes mellitus, BPH, CAD.  Patient had a fall around a week ago and his x-ray was negative.  Patient has been hobbling at home for the past week with a walker.  He went to see his PCP who directed him to the emergency room for evaluation for suspected hip fracture.  CT scan confirmed impacted minimally angulated subcapital femoral neck fracture on the left.  10/11.  Patient seen prior to surgery.  No complaints of chest pain or shortness of breath.  Assessment and Plan: * Closed displaced fracture of left femoral neck (HCC) Accidental fall Operating room today.  No contraindications to surgery.  Pain control.  CAD S/P percutaneous coronary angioplasty Patient with history of stent in 2015 Holding aspirin for surgery Continue atorvastatin 40 mg daily, Toprol 100 mg daily and losartan 100 mg daily  Chronic use of opiate drug for therapeutic purpose Chronic back pain with history of cervical and lumbar fusion Currently on Butrans patch which we will continue Morphine for breakthrough pain Avoid NSAIDs due to renal function  Type 2 diabetes mellitus with diabetic chronic kidney disease (HCC) Sliding scale insulin coverage.  Hemoglobin A1c good at 6.2.  CKD (chronic kidney disease) stage 4, GFR 15-29 ml/min (HCC) Creatinine 2.45 with a GFR of 26  Essential hypertension Continue amlodipine 5 mg daily, Toprol  Hypothyroidism Continue levothyroxine 200 mg daily  Hyperlipidemia, mixed On atorvastatin  Benign prostatic hyperplasia Continue finasteride 5 mg daily  Chronic gouty arthritis Continue allopurinol 100 mg  daily        Subjective: Patient stated he had a fall about a week ago and came to the ER and a x-ray was negative and he was sent home.  He has been hobbling at home with a walker.  He went to see his PCP and was referred into the ER for a CT scan which confirmed a hip fracture.  Physical Exam: Vitals:   11/26/22 0452 11/26/22 0621 11/26/22 0807 11/26/22 1216  BP: (!) 171/72 (!) 177/71 (!) 180/75 (!) 176/76  Pulse: 68 67 72 78  Resp:   18 18  Temp: 97.7 F (36.5 C)  97.6 F (36.4 C)   TempSrc:      SpO2: 100%  100% 99%  Weight:      Height:       Physical Exam HENT:     Head: Normocephalic.  Eyes:     General: Lids are normal.     Conjunctiva/sclera: Conjunctivae normal.  Cardiovascular:     Rate and Rhythm: Normal rate and regular rhythm.     Heart sounds: Normal heart sounds, S1 normal and S2 normal.  Pulmonary:     Breath sounds: No decreased breath sounds, wheezing, rhonchi or rales.  Abdominal:     Palpations: Abdomen is soft.     Tenderness: There is no abdominal tenderness.  Musculoskeletal:     Right lower leg: No swelling.     Left lower leg: No swelling.  Skin:    General: Skin is warm.     Findings: No rash.  Neurological:     Mental Status: He is alert and oriented to person, place, and time.  Comments: Patient with good sensation left foot.  Able to wiggle toes and flex at the ankle.     Data Reviewed: CT scan of the left hip reviewed, chest x-ray negative, Creatinine 2.45 with a GFR of 26, platelet count 122.  Disposition: Status is: Inpatient Remains inpatient appropriate because:  OR today for hip fracture  Planned Discharge Destination:   Rehab versus home with home health depending on progress    Time spent: 28 minutes  Author: Alford Highland, MD 11/26/2022 12:38 PM  For on call review [email or web address].

## 2022-11-26 NOTE — Consult Note (Signed)
ORTHOPAEDIC CONSULTATION  REQUESTING PHYSICIAN: Alford Highland, MD  Chief Complaint:   L hip pain  History of Present Illness: Alec White is a 82 y.o. male who had a fall ~1 week ago. Radiographs in the ED at that time were read as not showing an acute fracture. He had continued significant pain with weightbearing and presented back to the ED. CT scan showed a valgus impacted femoral neck fracture.  The patient ambulates with a cane at baseline. He lives at home with his wife. Pain is worse with any sort of movement.    He has a medical history significant for Hypertension, hyperlipidemia, CKD 4, hypothyroidism, chronic back pain s/p cervical and lumbar fusion on chronic opioids/Butrans patch, non-insulin-dependent type 2 diabetes, BPH, CAD s/p stenting.   History reviewed. No pertinent past medical history. Past Surgical History:  Procedure Laterality Date   spinal neurostimulator  08/31/2017   Social History   Socioeconomic History   Marital status: Married    Spouse name: Not on file   Number of children: Not on file   Years of education: Not on file   Highest education level: Not on file  Occupational History   Not on file  Tobacco Use   Smoking status: Never   Smokeless tobacco: Never  Substance and Sexual Activity   Alcohol use: Not Currently   Drug use: Not on file   Sexual activity: Not on file  Other Topics Concern   Not on file  Social History Narrative   Not on file   Social Determinants of Health   Financial Resource Strain: Low Risk  (11/25/2022)   Received from [facility]   Overall Financial Resource Strain (CARDIA)    Difficulty of Paying Living Expenses: Not hard at all  Food Insecurity: No Food Insecurity (11/26/2022)   Hunger Vital Sign    Worried About Running Out of Food in the Last Year: Never true    Ran Out of Food in the Last Year: Never true  Transportation  Needs: No Transportation Needs (11/26/2022)   PRAPARE - Administrator, Civil Service (Medical): No    Lack of Transportation (Non-Medical): No  Physical Activity: Not on file  Stress: Not on file  Social Connections: Not on file   History reviewed. No pertinent family history. No Known Allergies Prior to Admission medications   Medication Sig Start Date End Date Taking? Authorizing Provider  allopurinol (ZYLOPRIM) 100 MG tablet Take 100 mg by mouth daily.   Yes [provider]  amLODipine (NORVASC) 5 MG tablet Take 5 mg by mouth daily.   Yes [provider]  aspirin EC 81 MG tablet Take 81 mg by mouth daily. Swallow whole.   Yes [provider]  atorvastatin (LIPITOR) 40 MG tablet Take 40 mg by mouth daily.   Yes [provider]  buprenorphine (BUTRANS) 10 MCG/HR PTWK Place 1 patch onto the skin once a week.   Yes [provider]  cholecalciferol (VITAMIN D3) 25 MCG (1000 UNIT) tablet Take 5,000 Units by mouth daily.   Yes [provider]  empagliflozin (JARDIANCE) 10 MG TABS tablet Take 10 mg by mouth daily.   Yes [provider]  finasteride (PROSCAR) 5 MG tablet Take 5 mg by mouth daily.   Yes [provider]  levothyroxine (SYNTHROID) 200 MCG tablet Take 200 mcg by mouth daily before breakfast.   Yes [provider]  metoprolol succinate (TOPROL-XL) 100 MG 24 hr tablet Take  100 mg by mouth daily. Take with or immediately following a meal.   Yes [provider]  tamsulosin (FLOMAX) 0.4 MG CAPS capsule Take 0.4 mg by mouth.   Yes [provider]  timolol (BETIMOL) 0.5 % ophthalmic solution Place 1 drop into both eyes daily.   Yes [provider]   Recent Labs    11/25/22 2056 11/25/22 2116  WBC 6.3  --   HGB 13.7  --   HCT 42.5  --   PLT 122*  --   K 4.6  --   CL 103  --   CO2 23  --   BUN 46*  --   CREATININE 2.45*  --   GLUCOSE 126*  --   CALCIUM 9.6  --    INR  --  1.1   DG Chest Portable 1 View  Result Date: 11/25/2022 CLINICAL DATA:  Hip fracture. Medical clearance for surgical intervention. EXAM: PORTABLE CHEST 1 VIEW COMPARISON:  None Available. FINDINGS: The heart size and mediastinal contours are within normal limits. Both lungs are clear. The visualized skeletal structures are unremarkable. IMPRESSION: No active disease. Electronically Signed   By: Helyn Numbers M.D.   On: 11/25/2022 21:53   CT Hip Left Wo Contrast  Result Date: 11/25/2022 CLINICAL DATA:  Pain after fall EXAM: CT OF THE LEFT HIP WITHOUT CONTRAST TECHNIQUE: Multidetector CT imaging of the left hip was performed according to the standard protocol. Multiplanar CT image reconstructions were also generated. RADIATION DOSE REDUCTION: This exam was performed according to the departmental dose-optimization program which includes automated exposure control, adjustment of the mA and/or kV according to patient size and/or use of iterative reconstruction technique. COMPARISON:  No prior CT exam FINDINGS: Bones/Joint/Cartilage There is a minimally angulated nondisplaced, slightly impacted subcapital femoral neck fracture of the left hip. No additional fracture or dislocation. Degenerative changes seen of the sacroiliac joint at the edge of the imaging field. Slight concentric joint space loss of the left hip. Few subtle areas of sclerosis identified along the left iliac bone uncertain etiology and significance. Ligaments Suboptimally assessed by CT. Muscles and Tendons Mild muscle atrophy. Soft tissues Scattered vascular calcifications. Visualized pelvis is preserved without clear lymph node enlargement, free fluid. Prominent prostate with mass effect along the base of the bladder. IMPRESSION: Impacted minimally angulated subcapital femoral neck fracture on the left. Multifocal degenerative changes Electronically Signed   By: Karen Kays M.D.   On: 11/25/2022 18:19     Positive ROS: All  other systems have been reviewed and were otherwise negative with the exception of those mentioned in the HPI and as above.  Physical Exam: BP (!) 176/76   Pulse 78   Temp 97.6 F (36.4 C)   Resp 18   Ht 5\' 7"  (1.702 m)   Wt 87.1 kg   SpO2 99%   BMI 30.07 kg/m  General:  Alert, no acute distress Psychiatric:  Patient is competent for consent with normal mood and affect    Orthopedic Exam:  LLE: + DF/PF/EHL SILT grossly over foot Foot wwp +Log roll/axial load   Imaging:  As above: L valgus impacted femoral neck fracture  Assessment/Plan: Alec White is a 82 y.o. male with a L valgus impacted  femoral neck fracture  1. I discussed the various treatment options including both surgical and non-surgical management of the fracture with the patient and/or family. We discussed the risk of perioperative complications due to patient's age and other co-morbidities. After discussion  of risks, benefits, and alternatives to surgery, the family and/or patient were in agreement to proceed with surgery. The goals of surgery would be to provide adequate pain relief and allow for mobilization. Plan for surgery is L hip percutaneous pinning today, 11/26/2022. 2. NPO until OR 3. Hold anticoagulation in advance of OR   Signa Kell   11/26/2022 12:41 PM

## 2022-11-26 NOTE — Plan of Care (Signed)

## 2022-11-26 NOTE — Transfer of Care (Signed)
Immediate Anesthesia Transfer of Care Note  Patient: Alec White  Procedure(s) Performed: PERCUTANEOUS FIXATION OF FEMORAL NECK (Left: Hip)  Patient Location: PACU  Anesthesia Type:MAC  Level of Consciousness: drowsy and patient cooperative  Airway & Oxygen Therapy: Patient Spontanous Breathing and Patient connected to face mask oxygen  Post-op Assessment: Report given to RN, Post -op Vital signs reviewed and stable, and Patient moving all extremities X 4  Post vital signs: Reviewed and stable  Last Vitals:  Vitals Value Taken Time  BP 124/72 11/26/22 1447  Temp    Pulse 78 11/26/22 1450  Resp 13 11/26/22 1450  SpO2 94 % 11/26/22 1450  Vitals shown include unfiled device data.  Last Pain:  Vitals:   11/26/22 1216  TempSrc:   PainSc: 0-No pain         Complications: No notable events documented.

## 2022-11-26 NOTE — Hospital Course (Addendum)
82 year old man with past medical history of hypertension, hyperlipidemia, CKD stage IV, hypothyroidism, chronic back pain status post multiple operations and stimulator, non-insulin-dependent type 2 diabetes mellitus, BPH, CAD.  Patient had a fall around a week ago and his x-ray was negative.  Patient has been hobbling at home for the past week with a walker.  He went to see his PCP who directed him to the emergency room for evaluation for suspected hip fracture.  CT scan confirmed impacted minimally angulated subcapital femoral neck fracture on the left.  10/11.  Patient seen prior to surgery.  No complaints of chest pain or shortness of breath. 10/12.  Patient ambulated very well with physical therapy twice today and wants to go home with home health.  Not really having much pain and will likely stick with Tylenol.

## 2022-11-26 NOTE — TOC Initial Note (Signed)
Transition of Care [facility]) - Initial/Assessment Note    Patient Details  Name: Alec White MRN: [id number] Date of Birth: June 06, 1940  Transition of [facility] CM/SW Contact:    Hetty Ely, RN Phone Number: 11/26/2022, 2:27 PM  Clinical Narrative:  CM spoke with patient, with wife at bedside. 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 DME, walker, BSC, Shower chair, wheelchair, transport chair and grab bars in BR. Live in [place] on 3rd floor, elevator available. Following CVA, had IP Rehab. At [facility]. If needed post surgery would prefer to return if covered by Insurance. Wife also voices need for assistance at home, no family available to help.                  Barriers to Discharge: Continued Medical Work up   Patient Goals and CMS Choice Patient states their goals for this hospitalization and ongoing recovery are:: Per wife depends on post surgery recommendation.          Expected Discharge Plan and Services In-house Referral: Clinical Social Work Discharge Planning Services: CM Consult Post Acute Care Choice: NA Living arrangements for the past 2 months: Apartment (Condo, 3rd floow with elevator.)                 DME Arranged: Other see comment (Pending, post surgical eval.) DME Agency: Other - Comment (Pending)         HH Agency: Other - See comment (Pending)        Prior Living Arrangements/Services Living arrangements for the past 2 months: Apartment (Condo, 3rd floow with elevator.) Lives with:: Spouse Patient language and need for interpreter reviewed:: Yes Do you feel safe going back to the place where you live?: Yes      Need for Family Participation in Patient Care: Yes (Comment) Care giver support system in place?: Yes (comment) (wife) Current home services: DME (walker, bedside commode, wheelchair, transport chair and shower chair.) Criminal Activity/Legal Involvement Pertinent to Current  Situation/Hospitalization: No - Comment as needed  Activities of Daily Living   ADL Screening (condition at time of admission) Independently performs ADLs?: Yes (appropriate for developmental age) Is the patient deaf or have difficulty hearing?: Yes (Has hearing aids.) Does the patient have difficulty seeing, even when wearing glasses/contacts?: No Does the patient have difficulty concentrating, remembering, or making decisions?: No  Permission Sought/Granted                  Emotional Assessment Appearance:: Appears stated age Attitude/Demeanor/Rapport: Apprehensive Affect (typically observed): Accepting, Pleasant Orientation: : Oriented to Self, Oriented to Place, Oriented to Situation Alcohol / Substance Use: Not Applicable Psych Involvement: No (comment)  Admission diagnosis:  Hip fracture (HCC) [S72.009A] Closed fracture of left hip, initial encounter (HCC) [S72.002A] Patient Active Problem List   Diagnosis Date Noted   Closed displaced fracture of left femoral neck (HCC) 11/25/2022   Chronic back pain 11/25/2022   Chronic use of opiate drug for therapeutic purpose 11/25/2022   CKD (chronic kidney disease) stage 4, GFR 15-29 ml/min (HCC) 11/25/2022   Hypothyroidism 11/25/2022   Essential hypertension 11/25/2022   Benign prostatic hyperplasia 06/10/2021   Type 2 diabetes mellitus with diabetic chronic kidney disease (HCC) 06/10/2021   Chronic gouty arthritis 04/20/2016   Hyperlipidemia, mixed 10/23/2015   CAD S/P percutaneous coronary angioplasty 02/04/2015   Diet-controlled diabetes mellitus (HCC) 02/04/2015   PCP:  Danella Penton, MD Pharmacy:  No Pharmacies Listed  Social Determinants of Health (SDOH) Social History: SDOH Screenings   Food Insecurity: No Food Insecurity (11/26/2022)  Housing: Low Risk  (11/26/2022)  Transportation Needs: No Transportation Needs (11/26/2022)  Utilities: Not At Risk (11/26/2022)  Financial Resource Strain: Low Risk   (11/25/2022)   Received from [facility]  Tobacco Use: Low Risk  (11/26/2022)   SDOH Interventions:     Readmission Risk Interventions     No data to display

## 2022-11-26 NOTE — Anesthesia Procedure Notes (Signed)
Date/Time: 11/26/2022 1:40 PM  Performed by: Stormy Fabian, CRNAPre-anesthesia Checklist: Patient identified, Emergency Drugs available, Suction available and Patient being monitored Patient Re-evaluated:Patient Re-evaluated prior to induction Oxygen Delivery Method: Simple face mask Induction Type: IV induction Dental Injury: Teeth and Oropharynx as per pre-operative assessment

## 2022-11-26 NOTE — Plan of Care (Signed)
  Problem: Coping: Goal: Ability to adjust to condition or change in health will improve Outcome: Progressing   Problem: Fluid Volume: Goal: Ability to maintain a balanced intake and output will improve Outcome: Progressing   Problem: Health Behavior/Discharge Planning: Goal: Ability to identify and utilize available resources and services will improve Outcome: Progressing   Problem: Metabolic: Goal: Ability to maintain appropriate glucose levels will improve Outcome: Progressing   Problem: Tissue Perfusion: Goal: Adequacy of tissue perfusion will improve Outcome: Progressing   Problem: Coping: Goal: Level of anxiety will decrease Outcome: Progressing   Problem: Elimination: Goal: Will not experience complications related to bowel motility Outcome: Progressing Goal: Will not experience complications related to urinary retention Outcome: Progressing   Problem: Pain Managment: Goal: General experience of comfort will improve Outcome: Progressing   Problem: Safety: Goal: Ability to remain free from injury will improve Outcome: Progressing

## 2022-11-26 NOTE — H&P (Signed)
H&P reviewed. No significant changes noted.  

## 2022-11-27 DIAGNOSIS — E1122 Type 2 diabetes mellitus with diabetic chronic kidney disease: Secondary | ICD-10-CM | POA: Diagnosis not present

## 2022-11-27 DIAGNOSIS — Z79891 Long term (current) use of opiate analgesic: Secondary | ICD-10-CM | POA: Diagnosis not present

## 2022-11-27 DIAGNOSIS — I2510 Atherosclerotic heart disease of native coronary artery without angina pectoris: Secondary | ICD-10-CM | POA: Diagnosis not present

## 2022-11-27 DIAGNOSIS — S72002A Fracture of unspecified part of neck of left femur, initial encounter for closed fracture: Secondary | ICD-10-CM | POA: Diagnosis not present

## 2022-11-27 LAB — BASIC METABOLIC PANEL
Anion gap: 9 (ref 5–15)
BUN: 46 mg/dL — ABNORMAL HIGH (ref 8–23)
CO2: 23 mmol/L (ref 22–32)
Calcium: 8.8 mg/dL — ABNORMAL LOW (ref 8.9–10.3)
Chloride: 105 mmol/L (ref 98–111)
Creatinine, Ser: 2.65 mg/dL — ABNORMAL HIGH (ref 0.61–1.24)
GFR, Estimated: 23 mL/min — ABNORMAL LOW (ref >60)
Glucose, Bld: 110 mg/dL — ABNORMAL HIGH (ref 70–99)
Potassium: 4.6 mmol/L (ref 3.5–5.1)
Sodium: 137 mmol/L (ref 135–145)

## 2022-11-27 LAB — CBC
HCT: 35.5 % — ABNORMAL LOW (ref 39.0–52.0)
Hemoglobin: 12.1 g/dL — ABNORMAL LOW (ref 13.0–17.0)
MCH: 30.7 pg (ref 26.0–34.0)
MCHC: 34.1 g/dL (ref 30.0–36.0)
MCV: 90.1 fL (ref 80.0–100.0)
Platelets: 134 10*3/uL — ABNORMAL LOW (ref 150–400)
RBC: 3.94 MIL/uL — ABNORMAL LOW (ref 4.22–5.81)
RDW: 13 % (ref 11.5–15.5)
WBC: 6.1 10*3/uL (ref 4.0–10.5)
nRBC: 0 % (ref 0.0–0.2)

## 2022-11-27 LAB — GLUCOSE, CAPILLARY
Glucose-Capillary: 100 mg/dL — ABNORMAL HIGH (ref 70–99)
Glucose-Capillary: 113 mg/dL — ABNORMAL HIGH (ref 70–99)
Glucose-Capillary: 179 mg/dL — ABNORMAL HIGH (ref 70–99)
Glucose-Capillary: 97 mg/dL (ref 70–99)

## 2022-11-27 MED ORDER — ENSURE ENLIVE PO LIQD: 237.0000 mL | Freq: Two times a day (BID) | ORAL | 0 refills | Status: DC

## 2022-11-27 MED ORDER — POLYETHYLENE GLYCOL 3350 17 G PO PACK
17.0000 g | PACK | Freq: Every day | ORAL | Status: DC
  Administered 2022-11-27: 17 g via ORAL
  Filled 2022-11-27: qty 1

## 2022-11-27 MED ORDER — ACETAMINOPHEN 325 MG PO TABS: 325.0000 mg | ORAL_TABLET | Freq: Four times a day (QID) | ORAL | Status: DC | PRN

## 2022-11-27 MED ORDER — EMPAGLIFLOZIN 10 MG PO TABS
10.0000 mg | ORAL_TABLET | Freq: Every day | ORAL | Status: DC
  Administered 2022-11-27: 10 mg via ORAL
  Filled 2022-11-27: qty 1

## 2022-11-27 MED ORDER — POLYETHYLENE GLYCOL 3350 17 G PO PACK: 17.0000 g | PACK | Freq: Every day | ORAL | 0 refills | Status: AC

## 2022-11-27 MED ORDER — OXYCODONE HCL 5 MG PO TABS: 5.0000 mg | ORAL_TABLET | ORAL | 0 refills | Status: DC | PRN

## 2022-11-27 MED ORDER — ASPIRIN 81 MG PO TBEC: 81.0000 mg | DELAYED_RELEASE_TABLET | Freq: Two times a day (BID) | ORAL | 0 refills | Status: DC

## 2022-11-27 MED ORDER — ASPIRIN 81 MG PO TBEC: 81.0000 mg | DELAYED_RELEASE_TABLET | Freq: Two times a day (BID) | ORAL | 0 refills | Status: AC

## 2022-11-27 MED ORDER — OXYCODONE HCL 5 MG PO TABS: 5.0000 mg | ORAL_TABLET | Freq: Four times a day (QID) | ORAL | 0 refills | Status: DC | PRN

## 2022-11-27 NOTE — Progress Notes (Signed)
Physical Therapy Treatment Patient Details Name: Alec White MRN: [id number] DOB: Dec 03, 1940 Today's Date: 11/27/2022   History of Present Illness Alec White is a 82 y.o. male with medical history significant for Hypertension, hyperlipidemia, CKD 4, hypothyroidism chronic back pain s/p cervical and lumbar fusion on chronic opioids/Butrans patch, non-insulin-dependent type 2 diabetes, BPH, CAD, who was sent from the urgent care for evaluation for hip fracture.  Patient fell a week ago after getting up out of a chair and presented to the ED with hip pain and hip x-ray was negative for fracture.  He was discharged from the ED however he fell again the following day and he continued to have increasing pain with decreasing ability to ambulate.  He saw his PCP earlier in the day who directed him to the ED for CT to evaluate for hip fracture. S/P ORIF L femur.    PT Comments  Patient is agreeable to PT session. He wants to go home this pm. Wife present for this session. He is mod I with bed mobility, transfers with supervision. He is able to increase ambulation distance to 150 feet and demonstrates reciprocal pattern. No lob, mild pain and stiffness reported in L LE. Patient will continue to benefit from skilled PT to improve strength and functional independence.     If plan is discharge home, recommend the following: A little help with walking and/or transfers;A little help with bathing/dressing/bathroom;Assistance with cooking/housework;Assist for transportation;Help with stairs or ramp for entrance   Can travel by private vehicle      yes  Equipment Recommendations  None recommended by PT    Recommendations for Other Services       Precautions / Restrictions Precautions Precautions: Fall Restrictions Weight Bearing Restrictions: Yes LLE Weight Bearing: Weight bearing as tolerated     Mobility  Bed Mobility Overal bed mobility: Modified Independent                   Transfers Overall transfer level: Modified independent Equipment used: Rolling walker (2 wheels) Transfers: Sit to/from Stand Sit to Stand: Modified independent (Device/Increase time)                Ambulation/Gait Ambulation/Gait assistance: Supervision Gait Distance (Feet): 150 Feet Assistive device: Rolling walker (2 wheels) Gait Pattern/deviations: Step-through pattern Gait velocity: WNL     General Gait Details: quick pace, slightly fatigued, but no LOB with ambulation   Stairs             Wheelchair Mobility     Tilt Bed    Modified Rankin (Stroke Patients Only)       Balance Overall balance assessment: Modified Independent                                          Cognition Arousal: Alert Behavior During Therapy: WFL for tasks assessed/performed Overall Cognitive Status: Within Functional Limits for tasks assessed                                          Exercises      General Comments        Pertinent Vitals/Pain Pain Assessment Pain Assessment: Faces Faces Pain Scale: Hurts a little bit Pain Location: L hip Pain Descriptors / Indicators: Sore, Tightness Pain Intervention(s): Monitored  during session, Repositioned    Home Living Family/patient expects to be discharged to:: Private residence Living Arrangements: Spouse/significant other Available Help at Discharge: Family;Available 24 hours/day Type of Home: House Home Access: Stairs to enter   Entrance Stairs-Number of Steps: 1 Alternate Level Stairs-Number of Steps: flight Home Layout: Two level;Able to live on main level with bedroom/bathroom Home Equipment: Rolling Walker (2 wheels);Shower seat;Toilet riser      Prior Function            PT Goals (current goals can now be found in the care plan section) Acute Rehab PT Goals Patient Stated Goal: to go home PT Goal Formulation: With patient/family Time For Goal Achievement:  12/04/22 Potential to Achieve Goals: Good Progress towards PT goals: Progressing toward goals    Frequency    Min 1X/week      PT Plan      Co-evaluation              AM-PAC PT "6 Clicks" Mobility   Outcome Measure  Help needed turning from your back to your side while in a flat bed without using bedrails?: None Help needed moving from lying on your back to sitting on the side of a flat bed without using bedrails?: None Help needed moving to and from a bed to a chair (including a wheelchair)?: None Help needed standing up from a chair using your arms (e.g., wheelchair or bedside chair)?: None Help needed to walk in hospital room?: A Little Help needed climbing 3-5 steps with a railing? : A Little 6 Click Score: 22    End of Session   Activity Tolerance: Patient tolerated treatment well Patient left: in bed;with call bell/phone within reach;with bed alarm set;with family/visitor present Nurse Communication: Mobility status PT Visit Diagnosis: Muscle weakness (generalized) (M62.81);Difficulty in walking, not elsewhere classified (R26.2);Pain Pain - Right/Left: Left Pain - part of body: Hip     Time: 1027-2536 PT Time Calculation (min) (ACUTE ONLY): 15 min  Charges:    $Gait Training: 8-22 mins PT General Charges $$ ACUTE PT VISIT: 1 Visit                     Damarian Priola, PT, GCS 11/27/22,1:51 PM

## 2022-11-27 NOTE — Discharge Instructions (Addendum)
To prevent blood clot in the leg aspirin 81mg  twice a day for 15 days then can go back on your usual once a day aspirin    Diet: As you were doing prior to hospitalization   Shower:  May shower but keep the wounds dry, use an occlusive plastic wrap, NO SOAKING IN TUB.  If the bandage gets wet, change with a clean dry gauze.  Dressing:  You may change your dressing as needed. Change the dressing with sterile gauze dressing.    Activity:  Increase activity slowly as tolerated, but follow the weight bearing instructions below.  No lifting or driving for 6 weeks.  Weight Bearing:   Weight bearing as tolerated to left lower extremity  To prevent constipation: you may use a stool softener such as -  Colace (over the counter) 100 mg by mouth twice a day  Drink plenty of fluids (prune juice may be helpful) and high fiber foods Miralax (over the counter) for constipation as needed.    Itching:  If you experience itching with your medications, try taking only a single pain pill, or even half a pain pill at a time.  You may take up to 10 pain pills per day, and you can also use benadryl over the counter for itching or also to help with sleep.   Precautions:  If you experience chest pain or shortness of breath - call 911 immediately for transfer to the hospital emergency department!!  If you develop a fever greater that 101 F, purulent drainage from wound, increased redness or drainage from wound, or calf pain-Call [facility]                                              Follow- Up Appointment:  Please call for an appointment to be seen in 2 weeks at [facility]

## 2022-11-27 NOTE — Evaluation (Signed)
Physical Therapy Evaluation Patient Details Name: Alec White MRN: [id number] DOB: 1940/05/10 Today's Date: 11/27/2022  History of Present Illness  Alec White is a 82 y.o. male with medical history significant for Hypertension, hyperlipidemia, CKD 4, hypothyroidism chronic back pain s/p cervical and lumbar fusion on chronic opioids/Butrans patch, non-insulin-dependent type 2 diabetes, BPH, CAD, who was sent from the urgent care for evaluation for hip fracture.  Patient fell a week ago after getting up out of a chair and presented to the ED with hip pain and hip x-ray was negative for fracture.  He was discharged from the ED however he fell again the following day and he continued to have increasing pain with decreasing ability to ambulate.  He saw his PCP earlier in the day who directed him to the ED for CT to evaluate for hip fracture. S/P ORIF L femur.   Clinical Impression  Patient received in bed, he is agreeable to PT evaluation. Patient is pleasant with minimal pain. He is mod I with bed mobility. Transfers with cga. Ambulated 100 feet with RW and cga. No lob, good cadence. Doing well with mobility. Patient will continue to benefit from skilled PT to improve strength, endurance and independence.        If plan is discharge home, recommend the following: A little help with walking and/or transfers;A little help with bathing/dressing/bathroom;Assistance with cooking/housework;Assist for transportation;Help with stairs or ramp for entrance   Can travel by private vehicle    yes    Equipment Recommendations None recommended by PT  Recommendations for Other Services       Functional Status Assessment Patient has had a recent decline in their functional status and demonstrates the ability to make significant improvements in function in a reasonable and predictable amount of time.     Precautions / Restrictions Precautions Precautions: Fall Restrictions Weight Bearing Restrictions:  Yes LLE Weight Bearing: Weight bearing as tolerated      Mobility  Bed Mobility Overal bed mobility: Modified Independent                  Transfers Overall transfer level: Needs assistance Equipment used: Rolling walker (2 wheels) Transfers: Sit to/from Stand Sit to Stand: Contact guard assist                Ambulation/Gait Ambulation/Gait assistance: Contact guard assist Gait Distance (Feet): 100 Feet Assistive device: Rolling walker (2 wheels) Gait Pattern/deviations: Step-to pattern, Decreased step length - right, Decreased step length - left, Decreased stride length Gait velocity: slightly decreased     General Gait Details: patient ambulating well with RW and cga  Stairs            Wheelchair Mobility     Tilt Bed    Modified Rankin (Stroke Patients Only)       Balance Overall balance assessment: Modified Independent                                           Pertinent Vitals/Pain Pain Assessment Pain Assessment: Faces Faces Pain Scale: Hurts a little bit Pain Location: L hip Pain Descriptors / Indicators: Discomfort, Grimacing, Guarding, Sore Pain Intervention(s): Monitored during session, Premedicated before session, Repositioned    Home Living Family/patient expects to be discharged to:: Private residence Living Arrangements: Spouse/significant other Available Help at Discharge: Family;Available 24 hours/day Type of Home: House Home Access: Level  entry     Alternate Level Stairs-Number of Steps: flight Home Layout: Two level;Able to live on main level with bedroom/bathroom Home Equipment: Rolling Walker (2 wheels);Shower seat;Toilet riser      Prior Function Prior Level of Function : Driving             Mobility Comments: since fall last week he has been using walker, prior to that independent ADLs Comments: Independent prior to fall     Extremity/Trunk Assessment   Upper Extremity  Assessment Upper Extremity Assessment: Defer to OT evaluation    Lower Extremity Assessment Lower Extremity Assessment: LLE deficits/detail LLE: Unable to fully assess due to pain LLE Coordination: decreased gross motor    Cervical / Trunk Assessment Cervical / Trunk Assessment: Normal  Communication   Communication Communication: No apparent difficulties Cueing Techniques: Verbal cues  Cognition Arousal: Alert Behavior During Therapy: WFL for tasks assessed/performed Overall Cognitive Status: Within Functional Limits for tasks assessed                                          General Comments      Exercises     Assessment/Plan    PT Assessment Patient needs continued PT services  PT Problem List Decreased strength;Decreased range of motion;Decreased activity tolerance;Decreased balance;Decreased mobility;Pain;Decreased knowledge of use of DME       PT Treatment Interventions DME instruction;Gait training;Balance training;Functional mobility training;Therapeutic activities;Therapeutic exercise;Patient/family education;Modalities    PT Goals (Current goals can be found in the Care Plan section)  Acute Rehab PT Goals Patient Stated Goal: to go home PT Goal Formulation: With patient Time For Goal Achievement: 12/04/22 Potential to Achieve Goals: Good    Frequency Min 1X/week     Co-evaluation               AM-PAC PT "6 Clicks" Mobility  Outcome Measure Help needed turning from your back to your side while in a flat bed without using bedrails?: A Little Help needed moving from lying on your back to sitting on the side of a flat bed without using bedrails?: A Little Help needed moving to and from a bed to a chair (including a wheelchair)?: A Little Help needed standing up from a chair using your arms (e.g., wheelchair or bedside chair)?: A Little Help needed to walk in hospital room?: A Little Help needed climbing 3-5 steps with a railing? :  A Little 6 Click Score: 18    End of Session Equipment Utilized During Treatment: Gait belt Activity Tolerance: Patient tolerated treatment well Patient left: in chair;with call bell/phone within reach;with chair alarm set Nurse Communication: Mobility status PT Visit Diagnosis: Unsteadiness on feet (R26.81);Other abnormalities of gait and mobility (R26.89);Muscle weakness (generalized) (M62.81);Difficulty in walking, not elsewhere classified (R26.2);Pain;History of falling (Z91.81) Pain - Right/Left: Left Pain - part of body: Hip    Time: 8416-6063 PT Time Calculation (min) (ACUTE ONLY): 23 min   Charges:   PT Evaluation $PT Eval Moderate Complexity: 1 Mod PT Treatments $Gait Training: 8-22 mins PT General Charges $$ ACUTE PT VISIT: 1 Visit         Rosalio Catterton, PT, GCS 11/27/22,9:42 AM

## 2022-11-27 NOTE — TOC Progression Note (Addendum)
Transition of Care [facility]) - Progression Note    Patient Details  Name: Alec White MRN: [id number] Date of Birth: 09/28/1940  Transition of Care [facility]) CM/SW Contact  Liliana Cline, LCSW Phone Number: 11/27/2022, 12:29 PM  Clinical Narrative:    Met with patient at bedside. Patient is agreeable to therapy rec for home health. Offered choice. Provided Medicare Care Compare list of [facility] agencies. Patient agreed to review the list and let CSW know of agency preference prior to DC so that [facility] can be arranged. Provided CSW's direct number to call with choice.  2:30- Met with patient and spouse at bedside. They chose [facility]. Referral made to [facility] with [facility].      Barriers to Discharge: Continued Medical Work up  Expected Discharge Plan and Services In-house Referral: Clinical Social Work Discharge Planning Services: CM Consult Post Acute Care Choice: NA Living arrangements for the past 2 months: Apartment (Condo, 3rd floow with elevator.)                 DME Arranged: Other see comment (Pending, post surgical eval.) DME Agency: Other - Comment (Pending)         HH Agency: Other - See comment (Pending)         Social Determinants of Health (SDOH) Interventions SDOH Screenings   Food Insecurity: No Food Insecurity (11/26/2022)  Housing: Low Risk  (11/26/2022)  Transportation Needs: No Transportation Needs (11/26/2022)  Utilities: Not At Risk (11/26/2022)  Financial Resource Strain: Low Risk  (11/25/2022)   Received from [facility]  Tobacco Use: Low Risk  (11/26/2022)    Readmission Risk Interventions     No data to display

## 2022-11-27 NOTE — Evaluation (Signed)
Occupational Therapy Evaluation Patient Details Name: NIVEN CAPPUCCIO MRN: [id number] DOB: [birth year] Today's Date: 11/27/2022   History of Present Illness BERTHOLD WALTHER is a 82 y.o. male with medical history significant for Hypertension, hyperlipidemia, CKD 4, hypothyroidism chronic back pain s/p cervical and lumbar fusion on chronic opioids/Butrans patch, non-insulin-dependent type 2 diabetes, BPH, CAD, who was sent from the urgent care for evaluation for hip fracture.  Patient fell a week ago after getting up out of a chair and presented to the ED with hip pain and hip x-ray was negative for fracture.  He was discharged from the ED however he fell again the following day and he continued to have increasing pain with decreasing ability to ambulate.  He saw his PCP earlier in the day who directed him to the ED for CT to evaluate for hip fracture. S/P ORIF L femur.   Clinical Impression   Mr. Divelbiss presents with generalized weakness, reduced L LE ROM, and mild pain. He is able to perform bed mobility and perform sit to stand with RW and without physical assistance, is able to perform toileting with SUPV, reports he feels at or close to his baseline level of fxl mobility. Pt reports he would not like HHOT post DC, only OT. No further OT services required at this time.       If plan is discharge home, recommend the following:      Functional Status Assessment  Patient has had a recent decline in their functional status and demonstrates the ability to make significant improvements in function in a reasonable and predictable amount of time.  Equipment Recommendations  None recommended by OT    Recommendations for Other Services       Precautions / Restrictions Precautions Precautions: Fall Restrictions Weight Bearing Restrictions: Yes LLE Weight Bearing: Weight bearing as tolerated      Mobility Bed Mobility Overal bed mobility: Modified Independent                   Transfers Overall transfer level: Needs assistance Equipment used: Rolling walker (2 wheels) Transfers: Sit to/from Stand Sit to Stand: Supervision                  Balance Overall balance assessment: Modified Independent                                         ADL either performed or assessed with clinical judgement   ADL Overall ADL's : Needs assistance/impaired                     Lower Body Dressing: Moderate assistance   Toilet Transfer: Supervision/safety;Minimal assistance                   Vision         Perception         Praxis         Pertinent Vitals/Pain Pain Assessment Pain Assessment: 0-10 Pain Score: 2  Pain Location: L hip Pain Descriptors / Indicators: Sore Pain Intervention(s): Repositioned     Extremity/Trunk Assessment Upper Extremity Assessment Upper Extremity Assessment: Overall WFL for tasks assessed   Lower Extremity Assessment Lower Extremity Assessment: LLE deficits/detail LLE: Unable to fully assess due to pain LLE Sensation: WNL LLE Coordination: decreased gross motor   Cervical / Trunk Assessment Cervical / Trunk Assessment: Normal  Communication Communication Communication: No apparent difficulties Cueing Techniques: Verbal cues   Cognition Arousal: Alert Behavior During Therapy: WFL for tasks assessed/performed Overall Cognitive Status: Within Functional Limits for tasks assessed                                       General Comments       Exercises Other Exercises Other Exercises: Educ re: AE for LB dressing/bathing   Shoulder Instructions      Home Living Family/patient expects to be discharged to:: Private residence Living Arrangements: Spouse/significant other Available Help at Discharge: Family;Available 24 hours/day Type of Home: House Home Access: Stairs to enter [employer] of Steps: 1   Home Layout: Two level;Able to live on  main level with bedroom/bathroom Alternate Level Stairs-Number of Steps: flight   Bathroom Shower/Tub: Arts development officer Toilet: Handicapped height     Home Equipment: Agricultural consultant (2 wheels);Shower seat;Toilet riser          Prior Functioning/Environment Prior Level of Function : Independent/Modified Independent             Mobility Comments: since fall last week he has been using walker, prior to that independent ADLs Comments: Independent prior to fall, wife does most of driving        OT Problem List: Decreased activity tolerance;Decreased range of motion;Impaired balance (sitting and/or standing)      OT Treatment/Interventions:      OT Goals(Current goals can be found in the care plan section) Acute Rehab OT Goals Patient Stated Goal: to go home today OT Goal Formulation: With patient Time For Goal Achievement: 12/11/22 Potential to Achieve Goals: Good  OT Frequency:      Co-evaluation              AM-PAC OT "6 Clicks" Daily Activity     Outcome Measure Help from another person eating meals?: None Help from another person taking care of personal grooming?: None Help from another person toileting, which includes using toliet, bedpan, or urinal?: None Help from another person bathing (including washing, rinsing, drying)?: None Help from another person to put on and taking off regular upper body clothing?: None Help from another person to put on and taking off regular lower body clothing?: None 6 Click Score: 24   End of Session Equipment Utilized During Treatment: Rolling walker (2 wheels)  Activity Tolerance: Patient tolerated treatment well Patient left: in bed;with call bell/phone within reach;Other (comment) (with MD in room)  OT Visit Diagnosis: Muscle weakness (generalized) (M62.81);History of falling (Z91.81)                Time: 1310-1330 OT Time Calculation (min): 20 min Charges:  OT General Charges $OT Visit: 1 Visit Latina Craver, PhD, MS, OTR/L 11/27/22, 1:31 PM

## 2022-11-27 NOTE — Discharge Summary (Signed)
Physician Discharge Summary   Patient: Alec White MRN: [id number] DOB: 18-Feb-1940  Admit date:     11/25/2022  Discharge date: 11/27/22  Discharge Physician: Alford Highland   PCP: Danella Penton, MD   Recommendations at discharge:   Follow-up PCP 5 days Follow-up orthopedic surgeon around 14 days  Discharge Diagnoses: Principal Problem:   Closed displaced fracture of left femoral neck (HCC) Active Problems:   CAD S/P percutaneous coronary angioplasty   Chronic use of opiate drug for therapeutic purpose   Type 2 diabetes mellitus with diabetic chronic kidney disease (HCC)   CKD (chronic kidney disease) stage 4, GFR 15-29 ml/min (HCC)   Chronic gouty arthritis   Benign prostatic hyperplasia   Hyperlipidemia, mixed   Chronic back pain   Hypothyroidism   Essential hypertension    Hospital Course: 82 year old man with past medical history of hypertension, hyperlipidemia, CKD stage IV, hypothyroidism, chronic back pain status post multiple operations and stimulator, non-insulin-dependent type 2 diabetes mellitus, BPH, CAD.  Patient had a fall around a week ago and his x-ray was negative.  Patient has been hobbling at home for the past week with a walker.  He went to see his PCP who directed him to the emergency room for evaluation for suspected hip fracture.  CT scan confirmed impacted minimally angulated subcapital femoral neck fracture on the left.  10/11.  Patient seen prior to surgery.  No complaints of chest pain or shortness of breath. 10/12.  Patient ambulated very well with physical therapy twice today and wants to go home with home health.  Not really having much pain and will likely stick with Tylenol.  Assessment and Plan: * Closed displaced fracture of left femoral neck (HCC) Accidental fall Postoperative day 1.  Did well with physical therapy this morning and again this afternoon.  Wants to go home with home health.  Few pills of oxycodone prescribed but patient  likely will not take them.  Likely will stick with Tylenol.  Orthopedic surgery recommended aspirin twice daily for DVT prophylaxis.  CAD S/P percutaneous coronary angioplasty Patient with history of stent in 2015 Continue atorvastatin, Toprol aspirin and losartan.   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  Prescribed a small amount of oxycodone just in case has breakthrough pain.  Type 2 diabetes mellitus with diabetic chronic kidney disease (HCC) Sliding scale insulin coverage.  Hemoglobin A1c good at 6.2.  On Jardiance.  CKD (chronic kidney disease) stage 4, GFR 15-29 ml/min (HCC) Creatinine 2.65 with a GFR of 23  Essential hypertension Continue Toprol Norvasc and losartan  Hypothyroidism Continue levothyroxine  Hyperlipidemia, mixed On atorvastatin  Benign prostatic hyperplasia Continue finasteride  Chronic gouty arthritis Continue allopurinol 100 mg daily         Consultants: Orthopedic surgery Procedures performed: Percutaneous pinning of left femoral neck fracture Disposition: Home health Diet recommendation:  Cardiac and Carb modified diet DISCHARGE MEDICATION: Allergies as of 11/27/2022   No Known Allergies      Medication List     TAKE these medications    acetaminophen 325 MG tablet Commonly known as: TYLENOL Take 1-2 tablets (325-650 mg total) by mouth every 6 (six) hours as needed for mild pain (pain score 1-3 or temp > 100.5).   allopurinol 100 MG tablet Commonly known as: ZYLOPRIM Take 100 mg by mouth daily.   amLODipine 5 MG  tablet Commonly known as: NORVASC Take 5 mg by mouth daily.   aspirin EC 81 MG tablet Take 1 tablet (81 mg total) by mouth 2 (two) times daily. Swallow whole. What changed: when to take this   atorvastatin 40 MG tablet Commonly known as: LIPITOR Take 40 mg by mouth  daily.   buprenorphine 10 MCG/HR Ptwk Commonly known as: BUTRANS Place 1 patch onto the skin once a week.   cholecalciferol 25 MCG (1000 UNIT) tablet Commonly known as: VITAMIN D3 Take 5,000 Units by mouth daily.   empagliflozin 10 MG Tabs tablet Commonly known as: JARDIANCE Take 10 mg by mouth daily.   feeding supplement Liqd Take 237 mLs by mouth 2 (two) times daily between meals.   finasteride 5 MG tablet Commonly known as: PROSCAR Take 5 mg by mouth daily.   levothyroxine 200 MCG tablet Commonly known as: SYNTHROID Take 200 mcg by mouth daily before breakfast.   metoprolol succinate 100 MG 24 hr tablet Commonly known as: TOPROL-XL Take 100 mg by mouth daily. Take with or immediately following a meal.   oxyCODONE 5 MG immediate release tablet Commonly known as: Oxy IR/ROXICODONE Take 1 tablet (5 mg total) by mouth every 6 (six) hours as needed for severe pain.   polyethylene glycol 17 g packet Commonly known as: MIRALAX / GLYCOLAX Take 17 g by mouth daily. Start taking on: November 28, 2022   tamsulosin 0.4 MG Caps capsule Commonly known as: FLOMAX Take 0.4 mg by mouth.   timolol 0.5 % ophthalmic solution Commonly known as: BETIMOL Place 1 drop into both eyes daily.        Follow-up Information     Anson Oregon, PA-C Follow up in 14 day(s).   Specialty: Physician Assistant Why: Staple removal and x-rays. Contact information: [address] [place] [address] [phone]         Danella Penton, MD Follow up in 5 day(s).   Specialty: Internal Medicine Contact information: [address] [facility] [place] [address] [phone]                Discharge Exam: Filed Weights   11/25/22 1542  Weight: 87.1 kg   Physical Exam HENT:     Head: Normocephalic.  Eyes:     General: Lids are normal.     Conjunctiva/sclera: Conjunctivae normal.  Cardiovascular:     Rate and Rhythm: Normal  rate and regular rhythm.     Heart sounds: Normal heart sounds, S1 normal and S2 normal.  Pulmonary:     Breath sounds: No decreased breath sounds, wheezing, rhonchi or rales.  Abdominal:     Palpations: Abdomen is soft.     Tenderness: There is no abdominal tenderness.  Musculoskeletal:     Right lower leg: No swelling.     Left lower leg: No swelling.  Skin:    General: Skin is warm.     Findings: No rash.  Neurological:     Mental Status: He is alert and oriented to person, place, and time.     Comments: Able to lift left leg up off the chair.      Condition at discharge: stable  The results of significant diagnostics from this hospitalization (including imaging, microbiology, ancillary and laboratory) are listed below for reference.   Imaging Studies: DG HIP UNILAT WITH PELVIS 2-3 VIEWS LEFT  Result Date: 11/26/2022 CLINICAL DATA:  Elective surgery, left hip pinning. EXAM: DG HIP (WITH OR WITHOUT  PELVIS) 2-3V LEFT COMPARISON:  Preoperative imaging. FINDINGS: Three fluoroscopic spot views of the left hip obtained in the operating room. 3 screws traverse the left femoral neck. Fluoroscopy time 1 minutes 18 seconds. Dose 22.61 mGy. IMPRESSION: Intraoperative fluoroscopy during left hip pinning. Electronically Signed   By: Narda Rutherford M.D.   On: 11/26/2022 16:28   DG C-Arm 1-60 Min-No Report  Result Date: 11/26/2022 Fluoroscopy was utilized by the requesting physician.  No radiographic interpretation.   DG Chest Portable 1 View  Result Date: 11/25/2022 CLINICAL DATA:  Hip fracture. Medical clearance for surgical intervention. EXAM: PORTABLE CHEST 1 VIEW COMPARISON:  None Available. FINDINGS: The heart size and mediastinal contours are within normal limits. Both lungs are clear. The visualized skeletal structures are unremarkable. IMPRESSION: No active disease. Electronically Signed   By: Helyn Numbers M.D.   On: 11/25/2022 21:53   CT Hip Left Wo Contrast  Result Date:  11/25/2022 CLINICAL DATA:  Pain after fall EXAM: CT OF THE LEFT HIP WITHOUT CONTRAST TECHNIQUE: Multidetector CT imaging of the left hip was performed according to the standard protocol. Multiplanar CT image reconstructions were also generated. RADIATION DOSE REDUCTION: This exam was performed according to the departmental dose-optimization program which includes automated exposure control, adjustment of the mA and/or kV according to patient size and/or use of iterative reconstruction technique. COMPARISON:  No prior CT exam FINDINGS: Bones/Joint/Cartilage There is a minimally angulated nondisplaced, slightly impacted subcapital femoral neck fracture of the left hip. No additional fracture or dislocation. Degenerative changes seen of the sacroiliac joint at the edge of the imaging field. Slight concentric joint space loss of the left hip. Few subtle areas of sclerosis identified along the left iliac bone uncertain etiology and significance. Ligaments Suboptimally assessed by CT. Muscles and Tendons Mild muscle atrophy. Soft tissues Scattered vascular calcifications. Visualized pelvis is preserved without clear lymph node enlargement, free fluid. Prominent prostate with mass effect along the base of the bladder. IMPRESSION: Impacted minimally angulated subcapital femoral neck fracture on the left. Multifocal degenerative changes Electronically Signed   By: Karen Kays M.D.   On: 11/25/2022 18:19   DG Hip Unilat With Pelvis 2-3 Views Left  Result Date: 11/19/2022 CLINICAL DATA:  Fall.  Patient fell on left hip. EXAM: DG HIP (WITH OR WITHOUT PELVIS) 2-3V LEFT COMPARISON:  None Available. FINDINGS: Pelvis is intact with normal and symmetric sacroiliac joints. No acute fracture or dislocation. No aggressive osseous lesion. Visualized sacral arcuate lines are unremarkable. Unremarkable symphysis pubis. There are mild degenerative changes of bilateral hip joints with minimal joint space narrowing and osteophytosis of  the superior acetabulum. No radiopaque foreign bodies. Partially seen lumbosacral spinal fixation hardware. IMPRESSION: *No acute osseous abnormality.  Mild bilateral hip osteoarthritis. Electronically Signed   By: Jules Schick M.D.   On: 11/19/2022 13:45    Microbiology: Results for orders placed or performed during the hospital encounter of 11/25/22  Surgical pcr screen     Status: None   Collection Time: 11/26/22  4:30 AM   Specimen: Nasal Mucosa; Nasal Swab  Result Value Ref Range Status   MRSA, PCR NEGATIVE NEGATIVE Final   Staphylococcus aureus NEGATIVE NEGATIVE Final    Comment: (NOTE) The Xpert SA Assay (FDA approved for NASAL specimens in patients 23 years of age and older), is one component of a comprehensive surveillance program. It is not intended to diagnose infection nor to guide or monitor treatment. Performed at [facility], [address], [place], [place] [address]  Labs: CBC: Recent Labs  Lab 11/25/22 2056 11/27/22 0711  WBC 6.3 6.1  NEUTROABS 4.1  --   HGB 13.7 12.1*  HCT 42.5 35.5*  MCV 93.6 90.1  PLT 122* 134*   Basic Metabolic Panel: Recent Labs  Lab 11/25/22 2056 11/27/22 0711  NA 140 137  K 4.6 4.6  CL 103 105  CO2 23 23  GLUCOSE 126* 110*  BUN 46* 46*  CREATININE 2.45* 2.65*  CALCIUM 9.6 8.8*   Liver Function Tests: No results for input(s): "AST", "ALT", "ALKPHOS", "BILITOT", "PROT", "ALBUMIN" in the last 168 hours. CBG: Recent Labs  Lab 11/26/22 2013 11/26/22 2359 11/27/22 0346 11/27/22 0744 11/27/22 1132  GLUCAP 134* 100* 113* 97 179*    Discharge time spent: greater than 30 minutes.  Signed: Alford Highland, MD [facility] 11/27/2022

## 2022-11-27 NOTE — Plan of Care (Signed)

## 2022-11-27 NOTE — Plan of Care (Signed)
  Problem: Education: Goal: Knowledge of General Education information will improve Description: Including pain rating scale, medication(s)/side effects and non-pharmacologic comfort measures 11/27/2022 1317 by Frann Rider D, LPN Outcome: Progressing 11/27/2022 1316 by Frann Rider D, LPN Outcome: Progressing   Problem: Activity: Goal: Risk for activity intolerance will decrease 11/27/2022 1317 by Frann Rider D, LPN Outcome: Progressing 11/27/2022 1316 by Frann Rider D, LPN Outcome: Progressing   Problem: Nutrition: Goal: Adequate nutrition will be maintained 11/27/2022 1317 by Frann Rider D, LPN Outcome: Progressing 11/27/2022 1316 by Frann Rider D, LPN Outcome: Progressing   Problem: Pain Managment: Goal: General experience of comfort will improve 11/27/2022 1317 by Frann Rider D, LPN Outcome: Progressing 11/27/2022 1316 by Frann Rider D, LPN Outcome: Progressing   Problem: Safety: Goal: Ability to remain free from injury will improve 11/27/2022 1317 by Frann Rider D, LPN Outcome: Progressing 11/27/2022 1316 by Frann Rider D, LPN Outcome: Progressing

## 2022-11-27 NOTE — Progress Notes (Signed)
  Subjective: 1 Day Post-Op Procedure(s) (LRB): PERCUTANEOUS FIXATION OF FEMORAL NECK (Left) Patient reports pain as mild.   Patient is well, and has had no acute complaints or problems Plan is to go Home after hospital stay. Negative for chest pain and shortness of breath Fever: no Gastrointestinal:Negative for nausea and vomiting  Objective: Vital signs in last 24 hours: Temp:  [97.5 F (36.4 C)-98.3 F (36.8 C)] 98.3 F (36.8 C) (10/12 0741) Pulse Rate:  [68-88] 70 (10/12 0741) Resp:  [10-18] 16 (10/12 0345) BP: (110-180)/(68-91) 148/71 (10/12 0741) SpO2:  [96 %-100 %] 98 % (10/12 0741)  Intake/Output from previous day:  Intake/Output Summary (Last 24 hours) at 11/27/2022 0755 Last data filed at 11/27/2022 0430 Gross per 24 hour  Intake 1000 ml  Output 400 ml  Net 600 ml    Intake/Output this shift: No intake/output data recorded.  Labs: Recent Labs    11/25/22 2056  HGB 13.7   Recent Labs    11/25/22 2056  WBC 6.3  RBC 4.54  HCT 42.5  PLT 122*   Recent Labs    11/25/22 2056 11/27/22 0711  NA 140 137  K 4.6 4.6  CL 103 105  CO2 23 23  BUN 46* 46*  CREATININE 2.45* 2.65*  GLUCOSE 126* 110*  CALCIUM 9.6 8.8*   Recent Labs    11/25/22 2116  INR 1.1     EXAM General - Patient is Alert, Appropriate, and Oriented Extremity - ABD soft Neurovascular intact Dorsiflexion/Plantar flexion intact Incision: dressing C/D/I No cellulitis present Compartment soft Dressing/Incision - clean, dry, no drainage noted to the left hip honeycomb dressing. Motor Function - intact, moving foot and toes well on exam.  ABdomen soft with intact bowel sounds.  History reviewed. No pertinent past medical history.  Assessment/Plan: 1 Day Post-Op Procedure(s) (LRB): PERCUTANEOUS FIXATION OF FEMORAL NECK (Left) Principal Problem:   Closed displaced fracture of left femoral neck (HCC) Active Problems:   Chronic gouty arthritis   Benign prostatic hyperplasia    Type 2 diabetes mellitus with diabetic chronic kidney disease (HCC)   CAD S/P percutaneous coronary angioplasty   Hyperlipidemia, mixed   Chronic back pain   Chronic use of opiate drug for therapeutic purpose   CKD (chronic kidney disease) stage 4, GFR 15-29 ml/min (HCC)   Hypothyroidism   Essential hypertension  Estimated body mass index is 30.07 kg/m as calculated from the following:   Height as of this encounter: 5\' 7"  (1.702 m).   Weight as of this encounter: 87.1 kg. Advance diet Up with therapy D/C IV fluids when tolerating po intake.  Labs and vitals reviewed this AM. Patient is passing gas without pain. Up with therapy today.  If he clears goals with PT can plan on discharge home. Patient lives at home with his wife, no stairs to get into home.  Following discharge, follow-up with [facility] in 10-14 days for staple removal and x-rays of the left hip. Will plan for DVT prophylaxis with Aspirin BID for 14 days.  DVT Prophylaxis - Lovenox and TED hose Weight-Bearing as tolerated to left leg  J. Horris Latino, PA-C [facility] Orthopaedic Surgery 11/27/2022, 7:55 AM

## 2022-11-29 ENCOUNTER — Encounter: Payer: Self-pay | Admitting: Orthopedic Surgery

## 2022-12-03 NOTE — Anesthesia Postprocedure Evaluation (Signed)
Anesthesia Post Note  Patient: Alec White  Procedure(s) Performed: PERCUTANEOUS FIXATION OF FEMORAL NECK (Left: Hip)  Patient location during evaluation: PACU Anesthesia Type: Spinal Level of consciousness: awake and alert Pain management: pain level controlled Vital Signs Assessment: post-procedure vital signs reviewed and stable Respiratory status: spontaneous breathing, nonlabored ventilation, respiratory function stable and patient connected to nasal cannula oxygen Cardiovascular status: blood pressure returned to baseline and stable Postop Assessment: no apparent nausea or vomiting Anesthetic complications: no   No notable events documented.   Last Vitals:  Vitals:   11/27/22 0345 11/27/22 0741  BP: (!) 146/75 (!) 148/71  Pulse: 75 70  Resp: 16   Temp:  36.8 C  SpO2: 98% 98%    Last Pain:  Vitals:   11/27/22 1110  TempSrc:   PainSc: 3                  Yevette Edwards

## 2022-12-20 ENCOUNTER — Other Ambulatory Visit: Payer: Self-pay | Admitting: Orthopedic Surgery

## 2022-12-20 ENCOUNTER — Ambulatory Visit
Admission: RE | Admit: 2022-12-20 | Discharge: 2022-12-20 | Disposition: A | Discharge status: Home / Self Care | Payer: Medicare Other | Source: Ambulatory Visit | Attending: Orthopedic Surgery | Admitting: Orthopedic Surgery

## 2022-12-20 DIAGNOSIS — Z8781 Personal history of (healed) traumatic fracture: Secondary | ICD-10-CM

## 2023-07-07 ENCOUNTER — Other Ambulatory Visit: Payer: Self-pay | Admitting: Internal Medicine

## 2023-07-07 DIAGNOSIS — J320 Chronic maxillary sinusitis: Secondary | ICD-10-CM

## 2023-07-20 ENCOUNTER — Ambulatory Visit
Admission: RE | Admit: 2023-07-20 | Discharge: 2023-07-20 | Disposition: A | Discharge status: Home / Self Care | Source: Ambulatory Visit | Attending: Internal Medicine | Admitting: Internal Medicine

## 2023-07-20 DIAGNOSIS — J320 Chronic maxillary sinusitis: Secondary | ICD-10-CM | POA: Diagnosis present

## 2023-08-24 ENCOUNTER — Ambulatory Visit
Admission: RE | Admit: 2023-08-24 | Discharge: 2023-08-24 | Disposition: A | Discharge status: Home / Self Care | Source: Ambulatory Visit | Attending: Physician Assistant | Admitting: Physician Assistant

## 2023-08-24 ENCOUNTER — Other Ambulatory Visit: Payer: Self-pay | Admitting: Physician Assistant

## 2023-08-24 DIAGNOSIS — M7989 Other specified soft tissue disorders: Secondary | ICD-10-CM | POA: Insufficient documentation

## 2023-10-06 ENCOUNTER — Ambulatory Visit: Attending: Internal Medicine

## 2023-10-06 DIAGNOSIS — M6281 Muscle weakness (generalized): Secondary | ICD-10-CM | POA: Diagnosis present

## 2023-10-06 DIAGNOSIS — R262 Difficulty in walking, not elsewhere classified: Secondary | ICD-10-CM | POA: Insufficient documentation

## 2023-10-06 DIAGNOSIS — R2681 Unsteadiness on feet: Secondary | ICD-10-CM | POA: Diagnosis present

## 2023-10-06 NOTE — Therapy (Unsigned)
 OUTPATIENT PHYSICAL THERAPY NEURO EVALUATION   Patient Name: Alec White MRN: [id number] DOB:10/04/1940, 83 y.o., male Today's Date: 10/07/2023   PCP: Cleotilde Oneil FALCON, MD REFERRING PROVIDER: Cleotilde Oneil FALCON, MD  END OF SESSION:  PT End of Session - 10/06/23 1311     Visit Number 1    Number of Visits 25    Date for PT Re-Evaluation 12/29/23    PT Start Time 1315    PT Stop Time 1400    PT Time Calculation (min) 45 min    Equipment Utilized During Treatment Gait belt    Activity Tolerance Patient tolerated treatment well    Behavior During Therapy [facility] for tasks assessed/performed          History reviewed. No pertinent past medical history. 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/min (HCC) 11/25/2022   Hypothyroidism 11/25/2022   Essential hypertension 11/25/2022   Benign prostatic hyperplasia 06/10/2021   Type 2 diabetes mellitus with diabetic chronic kidney disease (HCC) 06/10/2021   Chronic gouty arthritis 04/20/2016   Hyperlipidemia, mixed 10/23/2015   CAD S/P percutaneous coronary angioplasty 02/04/2015   Diet-controlled diabetes mellitus (HCC) 02/04/2015    ONSET DATE: October 2024 is most recent  REFERRING DIAG: R42 (ICD-10-CM) - Disequilibrium  THERAPY DIAG:  Muscle weakness (generalized)  Difficulty in walking, not elsewhere classified  Unsteadiness on feet  Rationale for Evaluation and Treatment: Rehabilitation  SUBJECTIVE:                                                                                                                                                                                              SUBJECTIVE STATEMENT: Pt is a 83 y.o. male referred to OPPT for falls . Pt accompanied by: significant other  PERTINENT HISTORY:  Pt reports coming to PT for falls. Has had probably 4-5 falls. Pt denies any patterns with falls. Says it seems like his legs give away but he has also tripped leading to a fall posteriorly onto his tailbone which has been most recent. Pt is able to get up on his own if he has something he can hold onto  with his UE's. Does have significant lumbar and cervical fusion history but is unsure if his LE's are specifically weak from his prior surgeries.   PAIN:  Are you having pain? Yes: NPRS scale: 5-6/10 NPS Pain location: Sacrum and lower back Pain description: dull/sharp Aggravating factors: activity Relieving factors: rest  PRECAUTIONS: Back  RED FLAGS: None   WEIGHT BEARING RESTRICTIONS: No  FALLS: Has patient fallen in last 6 months? Yes. Number of falls 4-5  LIVING ENVIRONMENT: Lives with: lives with their spouse Lives in: House/apartment Stairs: Yes: Internal: flight steps; on right going up and on left going up Has following equipment at home: Single point cane, Walker - 2 wheeled, shower chair, bed side commode, and Grab bars  PLOF: Independent  PATIENT GOALS: Improve his balance and his back pain some  OBJECTIVE:  Note: Objective measures were completed at Evaluation unless otherwise noted.  DIAGNOSTIC FINDINGS: N/A  COGNITION: Overall cognitive status: Within functional limits for tasks assessed   SENSATION: WFL   POSTURE: rounded shoulders, forward head, decreased lumbar lordosis, and increased thoracic kyphosis  LOWER EXTREMITY ROM:     Active  Right Eval Left Eval  Hip flexion    Hip extension    Hip abduction    Hip adduction    Hip internal rotation    Hip external  rotation    Knee flexion    Knee extension    Ankle dorsiflexion    Ankle plantarflexion    Ankle inversion    Ankle eversion     (Blank rows = not tested)  LOWER EXTREMITY MMT:    MMT Right Eval Left Eval  Hip flexion 5 5  Hip extension    Hip abduction    Hip adduction    Hip internal rotation 4 4  Hip external rotation 5 5  Knee flexion    Knee extension 4 4  Ankle dorsiflexion 4 4  Ankle plantarflexion    Ankle inversion    Ankle eversion    (Blank rows = not tested)   TRANSFERS: Sit to stand: Modified independence  Assistive device utilized: None      STAIRS: Findings: Level of Assistance: Modified independence, Stair Negotiation Technique: Alternating Pattern  Forwards with Bilateral Rails, Number of Stairs: 4, Height of Stairs: 6   , and Comments:   GAIT:  No AD: Pt has forward truncal lean with limited step through pattern with poor foot clearance.   With RW: More upright posture with improved step lengths. Poor RW management with turns in clinic with single foot being outside RW BOS consisently with turns.    FUNCTIONAL TESTS:  5 times sit to stand: 15.45 seconds no UE support 6 minute walk test: deferred to next session 10 meter walk test:   No AD: .577 m/s  With RW: .66 m/s  TUG: 22 seconds no AD  Functional gait assessment:  FUNCTIONAL GAIT ASSESSMENT  Date: 10/06/23 Score  GAIT LEVEL SURFACE Instructions: Walk at your normal speed from here to the next mark (6 m) [20 ft]. (1) Moderate impairment-Walks 6 m (20 ft), slow speed, abnormal gait pattern, evidence for imbalance, or deviates 25.4 - 38.1 cm (10 -15 in) outside of the 30.48-cm (12-in) walkway width. Requires more than 7 seconds to ambulate 6 m (20 ft).  2.   CHANGE IN GAIT SPEED Instructions: Begin walking at your normal pace (for 1.5 m [5 ft]). When I tell you "go," walk as fast as you can (for 1.5 m [5 ft]).  When I tell you "slow," walk as slowly as you can (for 1.5 m [5 ft]. (2) Mild  impairment - Is able to change speed but demonstrates mild gait deviations, deviates 15.24 -25.4 cm (6 -10 in) outside of the 30.48-cm (12-in) walkway width, or no gait deviations but unable to achieve a significant change in velocity, or uses an assistive device  3.    GAIT WITH HORIZONTAL HEAD TURNS Instructions: Walk from here to the next mark 6 m (20 ft) away. Begin walking at your normal pace. Keep walking straight; after 3 steps, turn your head to the right and keep walking straight while looking to the right. After 3 more steps, turn your head to the left and keep walking straight while looking left. Continue alternating looking right and left. (1) Moderate impairment - Performs head turns with moderate change in gait velocity, slows down, deviates 25.4 -38.1 cm (10 -15 in) outside 30.48-cm (12-in) walkway width but recovers, can continue to walk.  4.   GAIT WITH VERTICAL HEAD TURNS Instructions: Walk from here to the next mark (6 m [20 ft]). Begin walking at your normal pace. Keep walking straight; after 3 steps, tip your head up and keep walking straight while looking up. After 3 more steps, tip your head down, keep walking straight while looking down. Continue  alternating looking up and down every 3 steps until you have completed 2 repetitions in each direction. (1) Moderate impairment - Performs task with moderate change in gait velocity, slows down, deviates 25.4 -38.1 cm (10 -15 in) outside 30.48-cm (12-in) walkway width but recovers, can continue to walk.  5.  GAIT AND PIVOT TURN Instructions: Begin with walking at your normal pace. When I tell you, "turn and stop," turn as quickly as you can to face the opposite direction and stop. (2) Mild impairment - Pivot turns safely in 3 seconds and stops with no loss of balance, or pivot turns safely within 3 seconds and stops with mild imbalance, requires small steps to catch balance  6.   STEP OVER OBSTACLE Instructions: Begin walking at your normal  speed. When you come to the shoe box, step over it, not around it, and keep walking. (1) Moderate impairment - Is able to step over one shoe box (11.43 cm [4.5 in] total height) but must slow down and adjust steps to clear box safely. May require verbal cueing.  7.   GAIT WITH NARROW BASE OF SUPPORT Instructions: Walk on the floor with arms folded across the chest, feet aligned heel to toe in tandem for a distance of 3.6 m [12 ft]. The number of steps taken in a straight line are counted for a maximum of 10 steps. (0) Severe impairment - Ambulates less than 4 steps heel to toe or cannot perform without assistance.  8.   GAIT WITH EYES CLOSED Instructions: Walk at your normal speed from here to the next mark (6 m [20 ft]) with your eyes closed. (1) Moderate impairment - Walks 6 m (20 ft), slow speed, abnormal gait pattern, evidence for imbalance, deviates 25.4 -38.1 cm (10 -15 in) outside 30.48-cm (12-in) walkway width. Requires more than 9 seconds to ambulate 6 m (20 ft).  9.   AMBULATING BACKWARDS Instructions: Walk backwards until I tell you to stop (1) Moderate impairment - Walks 6 m (20 ft), slow speed, abnormal gait pattern, evidence for imbalance, deviates 25.4 -38.1 cm (10 -15 in) outside 30.48-cm (12-in) walkway width.  10. STEPS Instructions: Walk up  these stairs as you would at home (ie, using the rail if necessary). At the top turn around and walk down. (2) Mild impairment-Alternating feet, must use rail.  Total 12/30   Interpretation of scores: Non-Specific Older Adults Cutoff Score: <=22/30 = risk of falls Parkinson's Disease Cutoff score <15/30= fall risk (Hoehn & Yahr 1-4)  Minimally Clinically Important Difference (MCID)  Stroke (acute, subacute, and chronic) = MDC: 4.2 points Vestibular (acute) = MDC: 6 points Community Dwelling Older Adults =  MCID: 4 points Parkinson's Disease  =  MDC: 4.3 points  (Academy of Neu[employer]ic Physical Therapy (nd). Functional Gait Assessment.  Retrieved from https://www.neuropt.org/docs/default-source/cpgs/core-outcome-measures/function-gait-assessment-pocket-guide-proof9-(2).pdf?sfvrsn=b43f35043_0.)                                                                                              TREATMENT DATE: 10/06/23  Eval only  PATIENT EDUCATION: Education details: RW use, prognosis, scores and how falls risk is reflected with current scores Person educated: Patient and Spouse Education method: Explanation Education comprehension: verbalized understanding  HOME EXERCISE PROGRAM: Deferred to next session  GOALS: Goals reviewed with patient? No  SHORT TERM GOALS: Target date: 11/21/23  Pt will be independent with HEP to Improve balance and LE strength to reduce falls risk.  Baseline:10/06/23: Deferred to next session Goal status: INITIAL  LONG TERM GOALS: Target date: 12/29/23  Pt will improve 5xSTS to < 14 seconds to demo adequate Le strength for age matched norms for community dwelling older adults.  Baseline: 10/06/23: 15.45 seconds  Goal status: INITIAL  2.  Pt will improve 10 meter gait speed with LRAD to at least .8 m/s to demonstrate reduced falls risk for household and short community distances Baseline: 10/06/23: No AD: .57 m/s, with RW: .66 m/s Goal status: INITIAL  3.  Pt will improve TUG to 12 seconds or less to demonstrate reduced falls risk with transfers and turns.  Baseline: 10/06/23: 22 seconds no AD Goal status: INITIAL  4.  Pt will improve FGA to >/= 19/30 to demonstrate clinically significant reduction in falls risk with community activities.  Baseline: 10/06/23: 12/30 Goal status: INITIAL ASSESSMENT:  CLINICAL IMPRESSION: Patient is a 83 y.o. male who was seen today for physical therapy evaluation and treatment for dysequilibrium due to recurrent falls. Pt presents with deficits in LE weakness, poor safety awareness with RW use, and high falls risk as evidenced by 5xSTS, TUG, FGA, and 10 meter gait  speed scores. These current scores especially lead to high falls risk for community level ambulation tasks leading to future risk of injury from falls with ADL's for age matched norms for other community dwelling older adults. Pt will benefit from skilled PT services to address these deficits to reduce falls risk and maximize independence with ADL's using LRAD.   OBJECTIVE IMPAIRMENTS: Abnormal gait, decreased balance, decreased knowledge of use of DME, decreased mobility, difficulty walking, decreased strength, decreased safety awareness, postural dysfunction, and pain.   ACTIVITY LIMITATIONS: carrying, standing, stairs, transfers, and locomotion level  PARTICIPATION LIMITATIONS: shopping and community activity  PERSONAL FACTORS: Age, Behavior pattern, Past/current experiences, Time since onset of injury/illness/exacerbation, and 3+ comorbidities: Hx  of multiple spine surgeries, DM, HTN, prior hip fx, CKD are also affecting patient's functional outcome.   REHAB POTENTIAL: Fair Chronicity of symptoms  CLINICAL DECISION MAKING: Evolving/moderate complexity  EVALUATION COMPLEXITY: Moderate  PLAN:  PT FREQUENCY: 1-2x/week  PT DURATION: 12 weeks  PLANNED INTERVENTIONS: 97164- PT Re-evaluation, 97110-Therapeutic exercises, 97530- Therapeutic activity, [id number]- Neuromuscular re-education, 97535- Self Care, [id number]- Gait training, Patient/Family education, Balance training, Stair training, and DME instructions  PLAN FOR NEXT SESSION: 6 MWT, establish safe balance HEP pt can complete at home   [facility]. Fairly IV, PT, DPT Physical Therapist- [place]  [employer] 10/07/2023, 10:50 AM

## 2023-10-11 ENCOUNTER — Ambulatory Visit

## 2023-10-11 DIAGNOSIS — M6281 Muscle weakness (generalized): Secondary | ICD-10-CM

## 2023-10-11 DIAGNOSIS — R262 Difficulty in walking, not elsewhere classified: Secondary | ICD-10-CM

## 2023-10-11 DIAGNOSIS — R2681 Unsteadiness on feet: Secondary | ICD-10-CM

## 2023-10-11 NOTE — Therapy (Signed)
 OUTPATIENT PHYSICAL THERAPY TREATMENT  Patient Name: Alec White MRN: [id number] DOB:[birth year], 83 y.o., male Today's Date: 10/11/2023  PCP: Cleotilde Oneil FALCON, MD REFERRING PROVIDER: Cleotilde Oneil FALCON, MD  END OF SESSION:  PT End of Session - 10/11/23 1154     Visit Number 2    Number of Visits 25    Date for PT Re-Evaluation 12/29/23    Authorization Type [employer]    Progress Note Due on Visit 10    PT Start Time 1145    PT Stop Time 1225    PT Time Calculation (min) 40 min    Equipment Utilized During Treatment Gait belt    Activity Tolerance Patient tolerated treatment well;No increased pain    Behavior During Therapy [facility] for tasks assessed/performed          No past medical history on file. 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/min (HCC) 11/25/2022   Hypothyroidism 11/25/2022   Essential hypertension 11/25/2022   Benign prostatic hyperplasia 06/10/2021   Type 2 diabetes mellitus with diabetic chronic kidney disease (HCC) 06/10/2021   Chronic gouty arthritis 04/20/2016   Hyperlipidemia, mixed 10/23/2015   CAD S/P percutaneous coronary angioplasty 02/04/2015   Diet-controlled diabetes mellitus (HCC) 02/04/2015    ONSET DATE: October 2024 is most recent REFERRING DIAG: R42 (ICD-10-CM) - Disequilibrium THERAPY DIAG:  Muscle weakness (generalized)  Difficulty in walking, not elsewhere classified  Unsteadiness on feet  Rationale for Evaluation and Treatment:  Rehabilitation  SUBJECTIVE:                                                                                                                                                                                             SUBJECTIVE STATEMENT: Pt has no pertinent updates. Pt reports he is just having awful balance and chronic back pain.  Pt accompanied by: significant other  PERTINENT HISTORY:  Pt reports coming to PT for falls. Has had probably 4-5 falls. Pt denies any patterns with falls. Says it seems like his legs give away but he has also tripped leading to a fall posteriorly onto his tailbone which has been most recent.  Pt is able to get up on his own if he has something he can hold onto with his UE's. Does have significant lumbar and cervical fusion history but is unsure if his LE's are specifically weak from his prior surgeries.   PAIN:  Are you having pain? 7/10 low back pain   PRECAUTIONS: Spine stimulator in place   WEIGHT BEARING RESTRICTIONS: No  FALLS: Has patient fallen in last 6 months? Yes. Number of falls 4-5  LIVING ENVIRONMENT: Lives with: lives with their spouse Lives in: House/apartment Stairs: Yes: Internal: flight steps; on right going up and on left going up Has following equipment at home: Single point cane, Walker - 2 wheeled, shower chair, bed side commode, and Grab bars  PLOF: Independent  PATIENT GOALS: Improve his balance and his back pain some  OBJECTIVE:  Note: Objective measures were completed at Evaluation unless otherwise noted.                                                                                              TREATMENT DATE: 10/06/23  -seated and standing BP 165/74 66 seated 167/71 standing   -AMB overground : 547ft c RW prior to need to sit (5'23)  -triceps elbow extension 2x15 with RTB  -wall pushup x12 -side stepping along wall x30sec  -standing 6 step taps c RW support x30    PATIENT EDUCATION: Education details: RW use,  prognosis, scores and how falls risk is reflected with current scores Person educated: Patient and Spouse Education method: Explanation Education comprehension: verbalized understanding  HOME EXERCISE PROGRAM: Deferred to next session  GOALS: Goals reviewed with patient? No  SHORT TERM GOALS: Target date: 11/21/23  Pt will be independent with HEP to Improve balance and LE strength to reduce falls risk.  Baseline:10/06/23: Deferred to next session Goal status: INITIAL  LONG TERM GOALS: Target date: 12/29/23  Pt will improve 5xSTS to < 14 seconds to demo adequate Le strength for age matched norms for community dwelling older adults.  Baseline: 10/06/23: 15.45 seconds  Goal status: INITIAL  2.  Pt will improve 10 meter gait speed with LRAD to at least .8 m/s to demonstrate reduced falls risk for household and short community distances Baseline: 10/06/23: No AD: .57 m/s, with RW: .66 m/s Goal status: INITIAL  3.  Pt will improve TUG to 12 seconds or less to demonstrate reduced falls risk with transfers and turns.  Baseline: 10/06/23: 22 seconds no AD Goal status: INITIAL  4.  Pt will improve FGA to >/= 19/30 to demonstrate clinically significant reduction in falls risk with community activities.  Baseline: 10/06/23: 12/30 Goal status: INITIAL ASSESSMENT:  CLINICAL IMPRESSION: Finished and started putting together HEP. Pt unable to cover 647ft during , has rapid decline in leg strength and stamina in arms after 3 minutes. Will plan on improving stamina in arms for RW use, as well as hip strength for latearl stability. Pt will benefit from skilled PT services to address these deficits to reduce falls risk and maximize independence with ADL's using LRAD.   OBJECTIVE IMPAIRMENTS: Abnormal gait, decreased balance, decreased knowledge of  use of DME, decreased mobility, difficulty walking, decreased strength, decreased safety awareness, postural dysfunction, and pain.   ACTIVITY  LIMITATIONS: carrying, standing, stairs, transfers, and locomotion level  PARTICIPATION LIMITATIONS: shopping and community activity  PERSONAL FACTORS: Age, Behavior pattern, Past/current experiences, Time since onset of injury/illness/exacerbation, and 3+ comorbidities: Hx of multiple spine surgeries, DM, HTN, prior hip fx, CKD are also affecting patient's functional outcome.   REHAB POTENTIAL: Fair Chronicity of symptoms  CLINICAL DECISION MAKING: Evolving/moderate complexity  EVALUATION COMPLEXITY: Moderate  PLAN:  PT FREQUENCY: 1-2x/week  PT DURATION: 12 weeks  PLANNED INTERVENTIONS: 97164- PT Re-evaluation, 97110-Therapeutic exercises, 97530- Therapeutic activity, [id number]- Neuromuscular re-education, [phone]- Self Care, [id number]- Gait training, Patient/Family education, Balance training, Stair training, and DME instructions  PLAN FOR NEXT SESSION:   12:37 PM, 10/11/23 Peggye JAYSON Linear, PT, DPT Physical Therapist - [facility]  Outpatient Physical Therapy- Main Campus [phone]

## 2023-10-13 ENCOUNTER — Ambulatory Visit: Admitting: Physical Therapy

## 2023-10-13 DIAGNOSIS — R2681 Unsteadiness on feet: Secondary | ICD-10-CM

## 2023-10-13 DIAGNOSIS — M6281 Muscle weakness (generalized): Secondary | ICD-10-CM | POA: Diagnosis not present

## 2023-10-13 DIAGNOSIS — R262 Difficulty in walking, not elsewhere classified: Secondary | ICD-10-CM

## 2023-10-13 NOTE — Therapy (Signed)
 OUTPATIENT PHYSICAL THERAPY TREATMENT  Patient Name: Alec White MRN: [id number] DOB:1940-07-02, 83 y.o., male Today's Date: 10/13/2023  PCP: Cleotilde Oneil FALCON, MD REFERRING PROVIDER: Cleotilde Oneil FALCON, MD  END OF SESSION:  PT End of Session - 10/13/23 1149     Visit Number 3    Number of Visits 25    Date for PT Re-Evaluation 12/29/23    Authorization Type [employer]    Progress Note Due on Visit 10    PT Start Time 1150    PT Stop Time 1230    PT Time Calculation (min) 40 min    Equipment Utilized During Treatment Gait belt    Activity Tolerance Patient tolerated treatment well;No increased pain    Behavior During Therapy [facility] for tasks assessed/performed          No past medical history on file. 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/min (HCC) 11/25/2022   Hypothyroidism 11/25/2022   Essential hypertension 11/25/2022   Benign prostatic hyperplasia 06/10/2021   Type 2 diabetes mellitus with diabetic chronic kidney disease (HCC) 06/10/2021   Chronic gouty arthritis 04/20/2016   Hyperlipidemia, mixed 10/23/2015   CAD S/P percutaneous coronary angioplasty 02/04/2015   Diet-controlled diabetes mellitus (HCC) 02/04/2015    ONSET DATE: October 2024 is most recent REFERRING DIAG: R42 (ICD-10-CM) - Disequilibrium THERAPY DIAG:  Muscle weakness (generalized)  Difficulty in walking, not elsewhere classified  Unsteadiness on feet  Rationale for Evaluation and Treatment:  Rehabilitation  SUBJECTIVE:                                                                                                                                                                                             SUBJECTIVE STATEMENT: Pt has no significant updates. Reports that he is having the regular back pain: 6/10 on this day.  Reports that he was able to complete HEP at home yesterday. Wife adjusted/modified handles to improve ease of grasp for UE interventions with sticks Pt accompanied by: significant other  PERTINENT HISTORY:  Pt reports coming to PT for falls. Has had probably 4-5 falls. Pt denies any patterns with falls. Says  it seems like his legs give away but he has also tripped leading to a fall posteriorly onto his tailbone which has been most recent. Pt is able to get up on his own if he has something he can hold onto with his UE's. Does have significant lumbar and cervical fusion history but is unsure if his LE's are specifically weak from his prior surgeries.   PAIN:  Are you having pain? 7/10 low back pain   PRECAUTIONS: Spine stimulator in place   WEIGHT BEARING RESTRICTIONS: No  FALLS: Has patient fallen in last 6 months? Yes. Number of falls 4-5  LIVING ENVIRONMENT: Lives with: lives with their spouse Lives in: House/apartment Stairs: Yes: Internal: flight steps; on right going up and on left going up Has following equipment at home: Single point cane, Walker - 2 wheeled, shower chair, bed side commode, and Grab bars  PLOF: Independent  PATIENT GOALS: Improve his balance and his back pain some  OBJECTIVE:  Note: Objective measures were completed at Evaluation unless otherwise noted.                                                                                              TREATMENT DATE: 10/06/23   Circuit training:   Sit<>stand without UE support 2 x 5  Seated:  LAQ x 12  HS curl x 12  March x 12  Hip abduction x 12  Gait with RW x 324ft.   Performed circuit x 2 bouts with therapeutic rest break between bouts.   Side stepping R and L with UE support on rail. 58ft x 5  Single limb step x 5 bil with BUE support   PT reports mild increase in tail bone pain following second bout of circuit training. Also required multiple seated rest breaks due to BLE fatigue and tail bone pain following standing interventions; 1 hydration rest break also required.    PATIENT EDUCATION: Education details: RW use, prognosis, scores and how falls risk is reflected with current scores Person educated: Patient and Spouse Education method: Explanation Education comprehension: verbalized understanding  HOME EXERCISE PROGRAM: Deferred to next session  GOALS: Goals reviewed with patient? No  SHORT TERM GOALS: Target date: 11/21/23  Pt will be independent with HEP to Improve balance and LE strength to reduce falls risk.  Baseline:10/06/23: Deferred to next session Goal status: INITIAL  LONG TERM GOALS: Target date: 12/29/23  Pt will improve 5xSTS to < 14 seconds to demo adequate Le strength for age matched norms for community dwelling older adults.  Baseline: 10/06/23: 15.45 seconds  Goal status: INITIAL  2.  Pt will improve 10 meter gait speed with LRAD to at least .8 m/s to demonstrate reduced falls risk for household and short community distances Baseline: 10/06/23: No AD: .57 m/s, with RW: .66 m/s Goal status: INITIAL  3.  Pt will improve TUG to 12 seconds or less to demonstrate reduced falls risk with transfers and turns.  Baseline: 10/06/23: 22 seconds no AD Goal status: INITIAL  4.  Pt will improve FGA to >/= 19/30 to demonstrate clinically significant reduction in falls risk with  community activities.  Baseline: 10/06/23: 12/30 Goal status: INITIAL ASSESSMENT:  CLINICAL IMPRESSION: Pt put forth excellent effort for PT interventions to address BLE strength, functional movement patterns and activity tolerance. Mild increase in tail  bone pain on second bout of circuit training, so transitioned to functional movement training. Tolerating HEP provided in prior visit for UE and LE strengthening. Pt will benefit from skilled PT services to address these deficits to reduce falls risk and maximize independence with ADL's using LRAD.   OBJECTIVE IMPAIRMENTS: Abnormal gait, decreased balance, decreased knowledge of use of DME, decreased mobility, difficulty walking, decreased strength, decreased safety awareness, postural dysfunction, and pain.   ACTIVITY LIMITATIONS: carrying, standing, stairs, transfers, and locomotion level  PARTICIPATION LIMITATIONS: shopping and community activity  PERSONAL FACTORS: Age, Behavior pattern, Past/current experiences, Time since onset of injury/illness/exacerbation, and 3+ comorbidities: Hx of multiple spine surgeries, DM, HTN, prior hip fx, CKD are also affecting patient's functional outcome.   REHAB POTENTIAL: Fair Chronicity of symptoms  CLINICAL DECISION MAKING: Evolving/moderate complexity  EVALUATION COMPLEXITY: Moderate  PLAN:  PT FREQUENCY: 1-2x/week  PT DURATION: 12 weeks  PLANNED INTERVENTIONS: 97164- PT Re-evaluation, 97110-Therapeutic exercises, 97530- Therapeutic activity, [id number]- Neuromuscular re-education, 97535- Self Care, [id number]- Gait training, Patient/Family education, Balance training, Stair training, and DME instructions  PLAN FOR NEXT SESSION:  Expand HEP, dynamic balance training, activity tolerance training.    Massie Dollar PT, DPT  Physical Therapist - [facility]  11:50 AM 10/13/23

## 2023-10-19 ENCOUNTER — Ambulatory Visit: Attending: Internal Medicine

## 2023-10-19 DIAGNOSIS — M6281 Muscle weakness (generalized): Secondary | ICD-10-CM | POA: Insufficient documentation

## 2023-10-19 DIAGNOSIS — R2681 Unsteadiness on feet: Secondary | ICD-10-CM | POA: Insufficient documentation

## 2023-10-19 DIAGNOSIS — R262 Difficulty in walking, not elsewhere classified: Secondary | ICD-10-CM | POA: Diagnosis present

## 2023-10-19 NOTE — Therapy (Signed)
 OUTPATIENT PHYSICAL THERAPY TREATMENT  Patient Name: Alec White MRN: [id number] DOB:01/24/1941, 83 y.o., male Today's Date: 10/19/2023  PCP: Cleotilde Oneil FALCON, MD REFERRING PROVIDER: Cleotilde Oneil FALCON, MD  END OF SESSION:  PT End of Session - 10/19/23 1455     Visit Number 4    Number of Visits 25    Date for PT Re-Evaluation 12/29/23    Authorization Type [employer]    Progress Note Due on Visit 10    PT Start Time 1445    PT Stop Time 1525    PT Time Calculation (min) 40 min    Equipment Utilized During Treatment Gait belt    Activity Tolerance Patient tolerated treatment well;No increased pain    Behavior During Therapy [facility] for tasks assessed/performed          No past medical history on file. 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/min (HCC) 11/25/2022   Hypothyroidism 11/25/2022   Essential hypertension 11/25/2022   Benign prostatic hyperplasia 06/10/2021   Type 2 diabetes mellitus with diabetic chronic kidney disease (HCC) 06/10/2021   Chronic gouty arthritis 04/20/2016   Hyperlipidemia, mixed 10/23/2015   CAD S/P percutaneous coronary angioplasty 02/04/2015   Diet-controlled diabetes mellitus (HCC) 02/04/2015    ONSET DATE: October 2024 is most recent REFERRING DIAG: R42 (ICD-10-CM) - Disequilibrium THERAPY DIAG:  Muscle weakness (generalized)  Difficulty in walking, not elsewhere classified  Unsteadiness on feet  Rationale for Evaluation and Treatment:  Rehabilitation  SUBJECTIVE:                                                                                                                                                                                             SUBJECTIVE STATEMENT: Pt has no significant updates. Reports that he is having the regular back pain: 6/10 on this day.  Reports that he was able to complete HEP at home yesterday. Wife adjusted/modified handles to improve ease of grasp for UE interventions with sticks Pt accompanied by: significant other  PERTINENT HISTORY:  Pt reports coming to PT for falls. Has had probably 4-5 falls. Pt denies any patterns with falls. Says  it seems like his legs give away but he has also tripped leading to a fall posteriorly onto his tailbone which has been most recent. Pt is able to get up on his own if he has something he can hold onto with his UE. Does have significant lumbar and cervical fusion history but is unsure if his LE are specifically weak from his prior surgeries.   PAIN:  Are you having pain? 7/10 low back pain (chronic, has been messing with stimulator levels recently)   PRECAUTIONS: Spine stimulator in place   WEIGHT BEARING RESTRICTIONS: No  FALLS: Has patient fallen in last 6 months? Yes. Number of falls 4-5  LIVING ENVIRONMENT: Lives with: lives with their spouse Lives in: House/apartment Stairs: Yes: Internal: flight steps; on right going up and on left going up Has following equipment at home: Single point cane, Walker - 2 wheeled, shower chair, bed side commode, and Grab bars  PLOF: Independent  PATIENT GOALS: Improve his balance and his back pain some  OBJECTIVE:  Note: Objective measures were completed at evaluation unless otherwise noted.                                                                                             TREATMENT DATE 10/19/23 :   -with 5b AW applied: 4 laps in /bars alternating forward and backward; the 4 laps side stepping  *rest   -with 5lb AW applied: alternating forward toe taps on KB handle 1x40, then alternating 180 degree turns *rest  -with 5lb AW applied: double heel raises x20, then chest press c basketball x20, then trunk twists with basketball x20;  *rest -with 5b AW applied: 4 laps in /bars alternating forward and backward; the 4 laps side stepping  *rest  -with 5lb AW applied: alternating forward toe taps on KB handle 1x40 *rest  -with 5lb AW applied on airex pad: double heel raises x20, then chest press c basketball x20, then trunk twists with basketball x20;  *rest -normal stance c therap tube slide pull 1x10 bilat   PATIENT EDUCATION: Education details: RW use, prognosis, scores and how falls risk is reflected with current scores Person educated: Patient and Spouse Education method: Explanation Education comprehension: verbalized understanding  HOME EXERCISE PROGRAM: Deferred to next session  GOALS: Goals reviewed with patient? No  SHORT TERM GOALS: Target date: 11/21/23  Pt will be independent with HEP to Improve balance and LE strength to reduce falls risk.  Baseline:10/06/23: Deferred to next session Goal status: INITIAL  LONG TERM GOALS: Target date: 12/29/23  Pt will improve 5xSTS to < 14 seconds to demo adequate Le strength for age matched norms for community dwelling older adults.  Baseline: 10/06/23: 15.45 seconds  Goal status: INITIAL  2.  Pt will improve 10 meter gait speed with LRAD to at least .8 m/s to demonstrate reduced falls risk for household and short community distances Baseline: 10/06/23: No AD: .57 m/s, with RW: .66 m/s Goal status: INITIAL  3.  Pt will improve TUG to 12 seconds or less to demonstrate reduced falls risk with transfers and turns.  Baseline: 10/06/23: 22 seconds no  AD Goal status: INITIAL  4.  Pt will improve FGA to >/= 19/30 to demonstrate clinically significant reduction in falls risk with community activities.  Baseline: 10/06/23: 12/30 Goal status:  INITIAL ASSESSMENT:  CLINICAL IMPRESSION: Advanced strengthening and balancing interventions this date, pt takes breaks as needed. Chronic back pain remains stable. Pt does well overall with 1-2 hand support, no LOB in session. Pt will benefit from skilled PT services to address these deficits to reduce falls risk and maximize independence with ADL's using LRAD.   OBJECTIVE IMPAIRMENTS: Abnormal gait, decreased balance, decreased knowledge of use of DME, decreased mobility, difficulty walking, decreased strength, decreased safety awareness, postural dysfunction, and pain.   ACTIVITY LIMITATIONS: carrying, standing, stairs, transfers, and locomotion level  PARTICIPATION LIMITATIONS: shopping and community activity  PERSONAL FACTORS: Age, Behavior pattern, Past/current experiences, Time since onset of injury/illness/exacerbation, and 3+ comorbidities: Hx of multiple spine surgeries, DM, HTN, prior hip fx, CKD are also affecting patient's functional outcome.   REHAB POTENTIAL: Fair Chronicity of symptoms  CLINICAL DECISION MAKING: Evolving/moderate complexity  EVALUATION COMPLEXITY: Moderate  PLAN: PT FREQUENCY: 1-2x/week PT DURATION: 12 weeks PLANNED INTERVENTIONS: 97164- PT Re-evaluation, 97110-Therapeutic exercises, 97530- Therapeutic activity, [id number]- Neuromuscular re-education, 97535- Self Care, [id number]- Gait training, Patient/Family education, Balance training, Stair training, and DME instructions PLAN FOR NEXT SESSION: Expand HEP, dynamic balance training, activity tolerance training.   3:19 PM, 10/19/23 Peggye JAYSON Linear, PT, DPT Physical Therapist - [facility]  Outpatient Physical Therapy- Main Campus [phone]

## 2023-10-26 ENCOUNTER — Ambulatory Visit

## 2023-10-26 DIAGNOSIS — M6281 Muscle weakness (generalized): Secondary | ICD-10-CM | POA: Diagnosis not present

## 2023-10-26 DIAGNOSIS — R2681 Unsteadiness on feet: Secondary | ICD-10-CM

## 2023-10-26 DIAGNOSIS — R262 Difficulty in walking, not elsewhere classified: Secondary | ICD-10-CM

## 2023-10-26 NOTE — Therapy (Signed)
 OUTPATIENT PHYSICAL THERAPY TREATMENT  Patient Name: Alec White MRN: [id number] DOB:March 24, 1940, 83 y.o., male Today's Date: 10/26/2023  PCP: Cleotilde Oneil FALCON, MD REFERRING PROVIDER: Cleotilde Oneil FALCON, MD  END OF SESSION:  PT End of Session - 10/26/23 1540     Visit Number 5    Number of Visits 25    Date for PT Re-Evaluation 12/29/23    Authorization Type [employer]    Progress Note Due on Visit 10    PT Start Time 1530    PT Stop Time 1610    PT Time Calculation (min) 40 min    Equipment Utilized During Treatment Gait belt    Activity Tolerance Patient tolerated treatment well;No increased pain    Behavior During Therapy [facility] for tasks assessed/performed          No past medical history on file. 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/min (HCC) 11/25/2022   Hypothyroidism 11/25/2022   Essential hypertension 11/25/2022   Benign prostatic hyperplasia 06/10/2021   Type 2 diabetes mellitus with diabetic chronic kidney disease (HCC) 06/10/2021   Chronic gouty arthritis 04/20/2016   Hyperlipidemia, mixed 10/23/2015   CAD S/P percutaneous coronary angioplasty 02/04/2015   Diet-controlled diabetes mellitus (HCC) 02/04/2015    ONSET DATE: October 2024 is most recent REFERRING DIAG: R42 (ICD-10-CM) - Disequilibrium THERAPY DIAG:  Muscle weakness (generalized)  Difficulty in walking, not elsewhere classified  Unsteadiness on feet  Rationale for Evaluation and Treatment:  Rehabilitation  SUBJECTIVE:                                                                                                                                                                                             SUBJECTIVE STATEMENT: Pt's back pain is elevated today. No other updates. Pt has questions about pain.    PERTINENT HISTORY:  Pt reports coming to PT for falls. Has had probably 4-5 falls. Pt denies any patterns with falls. Says it seems like his legs give away but he has also tripped leading to a fall posteriorly onto his tailbone which has been most recent. Pt is able to get up on  his own if he has something he can hold onto with his UE. Does have significant lumbar and cervical fusion history but is unsure if his LE are specifically weak from his prior surgeries.   PAIN:  Are you having pain? 8/10 low back pain (chronic, has been messing with stimulator levels recently)   PRECAUTIONS: Spine stimulator in place   WEIGHT BEARING RESTRICTIONS: No  FALLS: Has patient fallen in last 6 months? Yes. Number of falls 4-5  LIVING ENVIRONMENT: Lives with: lives with their spouse Lives in: House/apartment Stairs: Yes: Internal: flight steps; on right going up and on left going up Has following equipment at home: Single point cane, Walker - 2 wheeled, shower chair, bed side commode, and Grab bars  PLOF: Independent  PATIENT GOALS: Improve his balance and his back pain some  OBJECTIVE:  Note: Objective measures were completed at evaluation unless otherwise noted.                                                                                             TREATMENT DATE 10/26/23 :    -back pain elevated today, started with heat to low back  -overground AMB 374ft c RW, stopped when back pain reduced gait speed >25% (2:20sec)  -overground AMB 343ft c up walker trial- improved gait speed, back pain (1:43sec)  -overground AMB 417ft c up walker trial- improved gait speed, back pain  (2:25sec) made a height adjustment at his request *very successful on gait speed, distance tolerance; pt wants to look into renting a device at this time in lieu of purchasing one.  -with 5b AW applied: 4 laps in /bars alternating forward and backward; the 4 laps side stepping  *rest  -with 5lb AW applied: alternating forward toe taps on KB handle 1x40, then alternating 180 degree turns  PATIENT EDUCATION: Education details: Passenger transport manager, weightbearing restrictions after surgery per DR Kathlynn   Person educated: Patient and Spouse Education method: Explanation Education comprehension: verbalized understanding  HOME EXERCISE PROGRAM: Deferred to the future;  GOALS: Goals reviewed with patient? No  SHORT TERM GOALS: Target date: 11/21/23  Pt will be independent with HEP to Improve balance and LE strength to reduce falls risk.  Baseline:10/06/23: Deferred to next session Goal status: INITIAL  LONG TERM GOALS: Target date: 12/29/23  Pt will improve 5xSTS to < 14 seconds to demo adequate Le strength for age matched norms for community dwelling older adults.  Baseline: 10/06/23: 15.45 seconds  Goal status: INITIAL  2.  Pt will improve 10 meter gait speed with LRAD to at least .8 m/s to demonstrate reduced falls risk for household and short community distances Baseline: 10/06/23: No AD: .57 m/s, with RW: .66 m/s Goal status: INITIAL  3.  Pt will improve TUG to 12 seconds or less to demonstrate reduced falls risk with transfers and turns.  Baseline: 10/06/23: 22 seconds no AD Goal status: INITIAL  4.  Pt will improve FGA to >/= 19/30 to demonstrate clinically significant reduction in falls risk with community activities.  Baseline: 10/06/23: 12/30 Goal status: INITIAL ASSESSMENT:  CLINICAL IMPRESSION: Advanced strengthening and balancing interventions this  date, pt takes breaks as needed. Education on preparation for upcoming Left hand procedure and hand restrictions. Chronic back  pain remains stable. Pt does well overall with 1-2 hand support, no LOB in session. Successful trial of upwalker today which improved distance and speed in walking by 50%!!! Pt will benefit from skilled PT services to address these deficits to reduce falls risk and maximize independence with ADL's using LRAD.   OBJECTIVE IMPAIRMENTS: Abnormal gait, decreased balance, decreased knowledge of use of DME, decreased mobility, difficulty walking, decreased strength, decreased safety awareness, postural dysfunction, and pain.   ACTIVITY LIMITATIONS: carrying, standing, stairs, transfers, and locomotion level  PARTICIPATION LIMITATIONS: shopping and community activity  PERSONAL FACTORS: Age, Behavior pattern, Past/current experiences, Time since onset of injury/illness/exacerbation, and 3+ comorbidities: Hx of multiple spine surgeries, DM, HTN, prior hip fx, CKD are also affecting patient's functional outcome.   REHAB POTENTIAL: Fair Chronicity of symptoms  CLINICAL DECISION MAKING: Evolving/moderate complexity  EVALUATION COMPLEXITY: Moderate  PLAN: PT FREQUENCY: 1-2x/week PT DURATION: 12 weeks PLANNED INTERVENTIONS: 97164- PT Re-evaluation, 97110-Therapeutic exercises, 97530- Therapeutic activity, [id number]- Neuromuscular re-education, 97535- Self Care, [id number]- Gait training, Patient/Family education, Balance training, Stair training, and DME instructions  PLAN FOR NEXT SESSION: Expand HEP, dynamic balance training, activity tolerance training.   3:42 PM, 10/26/23 Peggye JAYSON Linear, PT, DPT Physical Therapist - [facility]  Outpatient Physical Therapy- Main Campus [phone]

## 2023-11-01 ENCOUNTER — Ambulatory Visit

## 2023-11-01 DIAGNOSIS — R2681 Unsteadiness on feet: Secondary | ICD-10-CM

## 2023-11-01 DIAGNOSIS — M6281 Muscle weakness (generalized): Secondary | ICD-10-CM | POA: Diagnosis not present

## 2023-11-01 DIAGNOSIS — R262 Difficulty in walking, not elsewhere classified: Secondary | ICD-10-CM

## 2023-11-01 NOTE — Therapy (Signed)
 OUTPATIENT PHYSICAL THERAPY TREATMENT  Patient Name: Alec White MRN: [id number] DOB:06/08/1940, 83 y.o., male Today's Date: 11/01/2023  PCP: Cleotilde Oneil FALCON, MD REFERRING PROVIDER: Cleotilde Oneil FALCON, MD  END OF SESSION:  PT End of Session - 11/01/23 1330     Visit Number 6    Number of Visits 25    Date for PT Re-Evaluation 12/29/23    Authorization Type [employer]    Progress Note Due on Visit 10    PT Start Time 1315    PT Stop Time 1355    PT Time Calculation (min) 40 min    Activity Tolerance Patient tolerated treatment well;No increased pain    Behavior During Therapy [facility] for tasks assessed/performed          No past medical history on file. 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/min (HCC) 11/25/2022   Hypothyroidism 11/25/2022   Essential hypertension 11/25/2022   Benign prostatic hyperplasia 06/10/2021   Type 2 diabetes mellitus with diabetic chronic kidney disease (HCC) 06/10/2021   Chronic gouty arthritis 04/20/2016   Hyperlipidemia, mixed 10/23/2015   CAD S/P percutaneous coronary angioplasty 02/04/2015   Diet-controlled diabetes mellitus (HCC) 02/04/2015    ONSET DATE: October 2024 is most recent REFERRING DIAG: R42 (ICD-10-CM) - Disequilibrium THERAPY DIAG:  Muscle weakness (generalized)  Difficulty in walking, not elsewhere classified  Unsteadiness on feet  Rationale for Evaluation and Treatment: Rehabilitation  SUBJECTIVE:                                                                                                                                                                                              SUBJECTIVE STATEMENT: Pt's back pain is me what better than last time, Dr Cleotilde gave him a new pain meds to help with recent exacerbation. Pt also got a script or a upwalker, is still looking  fpor a Teacher, music.   PERTINENT HISTORY:  Pt reports coming to PT for falls. Has had probably 4-5 falls. Pt denies any patterns with falls. Says it seems like his legs give away but he has also tripped leading to a fall posteriorly  onto his tailbone which has been most recent. Pt is able to get up on his own if he has something he can hold onto with his UE. Does have significant lumbar and cervical fusion history but is unsure if his LE are specifically weak from his prior surgeries.   PAIN:  Are you having pain? 7/10 low back pain   PRECAUTIONS: Spine stimulator in place   WEIGHT BEARING RESTRICTIONS: No  FALLS: Has patient fallen in last 6 months? Yes. Number of falls 4-5  LIVING ENVIRONMENT: Lives with: lives with their spouse Lives in: House/apartment Stairs: Yes: Internal: flight steps; on right going up and on left going up Has following equipment at home: Single point cane, Walker - 2 wheeled, shower chair, bed side commode, and Grab bars  PLOF: Independent  PATIENT GOALS: Improve his balance and his back pain some  OBJECTIVE:  Note: Objective measures were completed at evaluation unless otherwise noted.                                                                                             TREATMENT DATE 11/01/23 :    -back pain elevated today, integrated heat to low back   -overground AMB 430ft c clinic upwalker (supports at 3 holes from the top) *rest with heat  -overground AMB 432ft c clinic upwalker (supports at 3 holes from the top) *rest with heat  -with 5lb AW 4 laps in // bars side stepping with 4 step midway,  then alternate fwqd/backward 4 laps same *rest with heat  -with 5lb AW 4 laps in // bars side stepping with 4 step midway -static standing balance with ball tossing, then eyes closed firm stance  -blanac eon airex pad x60 quiet seconds, then head turn with card calling out    PATIENT EDUCATION: Education details: upwalker utility, weightbearing restrictions after surgery per DR Kathlynn   Person educated: Patient and Spouse Education method: Explanation Education comprehension: verbalized understanding  HOME EXERCISE PROGRAM: Deferred to the future;  GOALS: Goals reviewed with patient? No  SHORT TERM GOALS: Target date: 11/21/23  Pt will be independent with HEP to Improve balance and LE strength to reduce falls risk.  Baseline:10/06/23: Deferred to next session Goal status: INITIAL  LONG TERM GOALS: Target date: 12/29/23  Pt will improve 5xSTS to < 14 seconds to demo adequate Le strength for age matched norms for community dwelling older adults.  Baseline: 10/06/23: 15.45 seconds  Goal status: INITIAL  2.  Pt will improve 10 meter gait speed with LRAD to at least .8 m/s to demonstrate reduced falls risk for household and short community distances Baseline: 10/06/23: No AD: .57 m/s, with RW: .66 m/s Goal status: INITIAL  3.  Pt will improve TUG to 12 seconds or less to demonstrate reduced falls risk with transfers and turns.  Baseline: 10/06/23: 22 seconds no AD Goal status: INITIAL  4.  Pt will improve FGA to >/= 19/30 to demonstrate clinically significant reduction in falls risk with community activities.  Baseline: 10/06/23: 12/30 Goal status: INITIAL ASSESSMENT:  CLINICAL IMPRESSION: Continued with overground mobility. Pt does well with upwalker  this date, increased mobility distance tolerance. Usefd heat between effort sto aid in pain management of back. Pt will benefit from skilled PT services to address these deficits to reduce falls risk and maximize independence with  ADL's using LRAD.   OBJECTIVE IMPAIRMENTS: Abnormal gait, decreased balance, decreased knowledge of use of DME, decreased mobility, difficulty walking, decreased strength, decreased safety awareness, postural dysfunction, and pain.   ACTIVITY LIMITATIONS: carrying, standing, stairs, transfers, and locomotion level  PARTICIPATION LIMITATIONS: shopping and community activity  PERSONAL FACTORS: Age, Behavior pattern, Past/current experiences, Time since onset of injury/illness/exacerbation, and 3+ comorbidities: Hx of multiple spine surgeries, DM, HTN, prior hip fx, CKD are also affecting patient's functional outcome.   REHAB POTENTIAL: Fair Chronicity of symptoms  CLINICAL DECISION MAKING: Evolving/moderate complexity  EVALUATION COMPLEXITY: Moderate  PLAN: PT FREQUENCY: 1-2x/week PT DURATION: 12 weeks PLANNED INTERVENTIONS: 97164- PT Re-evaluation, 97110-Therapeutic exercises, 97530- Therapeutic activity, [id number]- Neuromuscular re-education, 97535- Self Care, [id number]- Gait training, Patient/Family education, Balance training, Stair training, and DME instructions  PLAN FOR NEXT SESSION: Expand HEP, dynamic balance training, activity tolerance training.   1:36 PM, 11/01/23 Peggye JAYSON Linear, PT, DPT Physical Therapist - [place] [facility]  Outpatient Physical Therapy- Main Campus [phone]

## 2023-11-03 ENCOUNTER — Ambulatory Visit

## 2023-11-08 ENCOUNTER — Ambulatory Visit

## 2023-11-10 ENCOUNTER — Ambulatory Visit

## 2023-11-15 ENCOUNTER — Ambulatory Visit

## 2023-11-17 NOTE — Progress Notes (Signed)
 This note has been created using automated tools and reviewed for accuracy by [facility].  Chief Complaint  Patient presents with  . Left Middle Finger - Post Operative Visit    Subjective  Alec White is a 83 y.o. male who presents for Post Operative Visit of the Left Middle Finger HPI History of Present Illness Alec White is an 83 year old male who presents for his second post-operative visit following trigger finger release for suture removal.  He has ongoing back issues, stating 'my back's still screwed up.' He has a history of significant back problems, having undergone six lower back fusions at levels L5 to S1 approximately ten years ago. Despite the surgeries, he continues to experience pain, which he rates as a six or seven out of ten. He has an Engineer, technical sales implanted, which has not alleviated his pain. The surgeries were performed by Dr. Dasie at [facility].  No unusual problems aside from his back pain.  Review of Systems  Patient Active Problem List  Diagnosis  . Coronary artery disease involving native coronary artery of native heart without angina pectoris  . Benign essential hypertension  . Hyperlipidemia, mixed  . DDD (degenerative disc disease), lumbar  . Chronic gouty arthritis  . Elevated PSA  . Persistent proteinuria  . Medicare annual wellness visit, initial  . Type 2 diabetes mellitus with stage 3 chronic kidney disease, without long-term current use of insulin  (CMS/HHS-HCC)  . Primary osteoarthritis of right knee  . Lesion of left native kidney  . 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 Sacroiliitis ()  . History of fracture of left hip  . Anemia in chronic kidney disease  . Chronic use of opiate drug for therapeutic purpose  . Gastroesophageal reflux disease  . Lumbar stenosis  . Hypothyroidism    Outpatient Medications Prior to Visit  Medication Sig Dispense Refill  . acetaminophen  (TYLENOL ) 500 MG  tablet Take 1,000 mg by mouth as needed for Pain.    . acetaminophen -codeine (TYLENOL  #3) 300-30 mg tablet Take 1 tablet by mouth 3 (three) times a day for 90 days 90 tablet 2  . allopurinoL  (ZYLOPRIM ) 100 MG tablet Take 1 tablet (100 mg total) by mouth once daily 90 tablet 3  . amoxicillin (AMOXIL) 500 MG capsule TAKE FOUR CAPSULES BY MOUTH ONE HOUR BEFORE DENTAL APPOINTMENT    . aspirin  81 MG EC tablet Take 81 mg by mouth once daily.    . atorvastatin  (LIPITOR) 40 MG tablet Take 1 tablet (40 mg total) by mouth once daily 90 tablet 3  . blood glucose diagnostic (ONETOUCH ULTRA TEST) test strip USE 1 STRIP TO CHECK GLUCOSE ONCE DAILY (Patient taking differently: USE 1 STRIP TO CHECK GLUCOSE ONCE DAILY checks every 3 days) 100 each 3  . blood glucose meter kit Use to test blood sugar once daily. (Patient taking differently: Use to test blood sugar once daily. Checks every 3 days) 1 each 0  . empagliflozin  (JARDIANCE ) 10 mg tablet Take 1 tablet (10 mg total) by mouth twice a week 30 tablet 2  . finasteride (PROSCAR) 5 mg tablet Take 1 tablet (5 mg total) by mouth once daily 90 tablet 3  . fluticasone propionate (FLONASE) 50 mcg/actuation nasal spray Place 2 sprays into both nostrils 2 (two) times daily    . levothyroxine  (EUTHYROX ) 200 MCG tablet Take 1 tablet (200 mcg total) by mouth every morning before breakfast (0630) TAKE ON AN EMPTY STOMACH WITH  A GLASS OF WATER AT LEAST 30-60 MINUTES BEFORE BREAKFAST 90 tablet 3  . metoclopramide  (REGLAN ) 5 MG tablet 1 tab before breakfast, 1 before supper, bedtime 90 tablet 11  . metoprolol  SUCCinate (TOPROL -XL) 100 MG XL tablet Take 1 tablet (100 mg total) by mouth once daily 90 tablet 3  . pantoprazole (PROTONIX) 40 MG DR tablet Take 1 tablet (40 mg total) by mouth 2 (two) times daily before meals 180 tablet 3  . tamsulosin (FLOMAX) 0.4 mg capsule Take 1 capsule (0.4 mg total) by mouth once daily 30 MINUTES AFTER THE SAME MEAL EACH DAY 90 capsule 3  . timoloL   maleate (TIMOPTIC ) 0.5 % ophthalmic solution Place 1 drop into both eyes every morning     No facility-administered medications prior to visit.      Objective  Vitals:   11/16/23 1300  Weight: 85.3 kg (188 lb)  Height: 170.2 cm (5' 7)  PainSc:   7  PainLoc: Back   Body mass index is 29.44 kg/m.  Home Vitals:     Physical Exam Physical Exam MUSCULOSKELETAL: Full range of motion in the hand.  Constitutional: alert, in NAD, and communicates well Eye exam:  Results RADIOLOGY Lumbar spine X-ray: No broken hardware, reactive bone formation around hardware, no windshield wiper effect (09/16/2023)  PROCEDURE Suture removal: Suture removed due to the SteriStrip.     Assessment/Plan:   Assessment & Plan Status post trigger finger release, post-operative wound care   At the second post-operative visit for suture removal following trigger finger release, the wound is healed with full motion in the hand and no complications. Remove sutures and apply SteriStrip. Diagnoses and all orders for this visit:  Trigger middle finger of left hand  S/P trigger finger release    This visit was coded based on medical decision making (MDM).           Future Appointments     Date/Time Provider Department Center Visit Type   01/02/2024 2:00 PM Cleotilde Oneil Novel, MD [facility] MARYL BROCKS [facility] OFFICE VISIT   03/20/2024 7:45 AM [facility] WEST LAB [facility] C LAB   03/27/2024 3:30 PM Cleotilde Oneil Novel, MD [facility] MARYL BROCKS [facility] OFFICE VISIT       There are no Patient Instructions on file for this visit.  An after visit summary was provided for the patient either in written format (printed) or through My [facility].  This note has been created using automated tools and reviewed for accuracy by [facility].

## 2023-11-22 ENCOUNTER — Ambulatory Visit: Attending: Internal Medicine

## 2023-11-22 DIAGNOSIS — M6281 Muscle weakness (generalized): Secondary | ICD-10-CM | POA: Insufficient documentation

## 2023-11-22 DIAGNOSIS — R2681 Unsteadiness on feet: Secondary | ICD-10-CM | POA: Insufficient documentation

## 2023-11-22 DIAGNOSIS — R262 Difficulty in walking, not elsewhere classified: Secondary | ICD-10-CM | POA: Diagnosis present

## 2023-11-22 NOTE — Therapy (Signed)
 OUTPATIENT PHYSICAL THERAPY TREATMENT  Patient Name: Alec White MRN: [id number] DOB:16-Sep-1940, 83 y.o., male Today's Date: 11/22/2023  PCP: Cleotilde Oneil FALCON, MD REFERRING PROVIDER: Cleotilde Oneil FALCON, MD  END OF SESSION:  PT End of Session - 11/22/23 1329     Visit Number 8    Number of Visits 25    Date for Recertification  12/29/23    Authorization Type [employer]    Progress Note Due on Visit 10    PT Start Time 1325    PT Stop Time 1355    PT Time Calculation (min) 30 min    Equipment Utilized During Treatment Gait belt    Activity Tolerance Patient tolerated treatment well;No increased pain    Behavior During Therapy [facility] for tasks assessed/performed          No past medical history on file. 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/min (HCC) 11/25/2022   Hypothyroidism 11/25/2022   Essential hypertension 11/25/2022   Benign prostatic hyperplasia 06/10/2021   Type 2 diabetes mellitus with diabetic chronic kidney disease (HCC) 06/10/2021   Chronic gouty arthritis 04/20/2016   Hyperlipidemia, mixed 10/23/2015   CAD S/P percutaneous coronary angioplasty 02/04/2015   Diet-controlled diabetes mellitus (HCC) 02/04/2015    ONSET DATE: October 2024 is most recent REFERRING DIAG: R42 (ICD-10-CM) - Disequilibrium THERAPY DIAG:  Muscle weakness (generalized)  Difficulty in walking, not elsewhere classified  Unsteadiness on feet  Rationale for Evaluation and Treatment:  Rehabilitation  SUBJECTIVE:                                                                                                                                                                                             SUBJECTIVE STATEMENT: Pt back after 3 week hiatus, but hand is healing nicely, stitches have been out for a while now. Pt having a hard time finding a local DME provider that takes medicare. Pt having a lot foot pain bilat, achy.   PERTINENT HISTORY:  Pt reports coming to PT for falls. Has had probably 4-5 falls. Pt denies any patterns with falls. Says it seems like his legs give away  but he has also tripped leading to a fall posteriorly onto his tailbone which has been most recent. Pt is able to get up on his own if he has something he can hold onto with his UE. Does have significant lumbar and cervical fusion history but is unsure if his LE are specifically weak from his prior surgeries.   PAIN:  Are you having pain? 7/10 low back pain   PRECAUTIONS: Spine stimulator in place   WEIGHT BEARING RESTRICTIONS: No  FALLS: Has patient fallen in last 6 months? Yes. Number of falls 4-5  LIVING ENVIRONMENT: Lives with: lives with their spouse Lives in: House/apartment Stairs: Yes: Internal: flight steps; on right going up and on left going up Has following equipment at home: Single point cane, Walker - 2 wheeled, shower chair, bed side commode, and Grab bars  PLOF: Independent  PATIENT GOALS: Improve his balance and his back pain some  OBJECTIVE:  Note: Objective measures were completed at evaluation unless otherwise noted.                                                                                             TREATMENT DATE 11/22/23 :   -overground AMB 446ft c clinic upwalker (supports at 3 holes from the top) 5m23s -seated back heat -overground AMB 446ft c clinic upwalker (supports at 3 holes from the top) 105m27s -seated back heat -stance on airex pad normal stance  x60sec, then alternating head turns x16   11/01/23 -back pain elevated today, integrated heat to low back  -overground AMB 412ft c clinic upwalker (supports at 3 holes from the top) *rest with heat  -overground AMB 442ft c clinic upwalker (supports at 3 holes from the top) *rest with heat  -with 5lb AW 4 laps in // bars side stepping with 4 step midway, then alternate fwqd/backward 4 laps same *rest with heat  -with 5lb AW 4 laps in // bars side stepping with 4 step midway -static standing balance with ball tossing, then eyes closed firm stance  -blanace on airex pad x60 quiet seconds, then head turn with card calling out    PATIENT EDUCATION: Education details: upwalker utility, weightbearing restrictions after surgery per DR Kathlynn   Person educated: Patient and Spouse Education method: Explanation Education comprehension: verbalized understanding  HOME EXERCISE PROGRAM: Deferred to the future;  GOALS: Goals reviewed with patient? No  SHORT TERM GOALS: Target date: 11/21/23  Pt will be independent with HEP to Improve balance and LE strength to reduce falls risk.  Baseline:10/06/23: Deferred to next session Goal status: INITIAL  LONG TERM GOALS: Target date: 12/29/23  Pt will improve 5xSTS to < 14 seconds to demo adequate Le strength for age matched norms for community dwelling older adults.  Baseline: 10/06/23: 15.45 seconds  Goal status: INITIAL  2.  Pt will improve 10 meter gait speed with LRAD to at least .8 m/s to demonstrate reduced falls risk for household and short community distances Baseline: 10/06/23: No AD: .57 m/s, with RW: .66 m/s Goal status: INITIAL  3.  Pt will improve TUG to 12 seconds or less to demonstrate reduced  falls risk with transfers and turns.  Baseline: 10/06/23: 22 seconds no AD Goal status: INITIAL  4.  Pt will improve FGA to >/= 19/30 to demonstrate clinically significant reduction in falls risk with community activities.  Baseline: 10/06/23:  12/30 Goal status: INITIAL ASSESSMENT:  CLINICAL IMPRESSION: Pt demonstrates continued function in AMB since last visit 3 weeks ago, no major changes since hiatus. Used heat between effort sto aid in pain management of back. Pt will benefit from skilled PT services to address these deficits to reduce falls risk and maximize independence with ADL's using LRAD.   OBJECTIVE IMPAIRMENTS: Abnormal gait, decreased balance, decreased knowledge of use of DME, decreased mobility, difficulty walking, decreased strength, decreased safety awareness, postural dysfunction, and pain.   ACTIVITY LIMITATIONS: carrying, standing, stairs, transfers, and locomotion level  PARTICIPATION LIMITATIONS: shopping and community activity  PERSONAL FACTORS: Age, Behavior pattern, Past/current experiences, Time since onset of injury/illness/exacerbation, and 3+ comorbidities: Hx of multiple spine surgeries, DM, HTN, prior hip fx, CKD are also affecting patient's functional outcome.   REHAB POTENTIAL: Fair Chronicity of symptoms  CLINICAL DECISION MAKING: Evolving/moderate complexity  EVALUATION COMPLEXITY: Moderate  PLAN: PT FREQUENCY: 1-2x/week PT DURATION: 12 weeks PLANNED INTERVENTIONS: 97164- PT Re-evaluation, 97110-Therapeutic exercises, 97530- Therapeutic activity, 97112- Neuromuscular re-education, [phone]- Self Care, 02883- Gait training, Patient/Family education, Balance training, Stair training, and DME instructions  PLAN FOR NEXT SESSION: Expand HEP, dynamic balance training, activity tolerance training.   1:31 PM, 11/22/23 Peggye JAYSON Linear, PT, DPT Physical Therapist - [place] [facility]  Outpatient Physical Therapy- Main Campus [phone]

## 2023-11-24 ENCOUNTER — Ambulatory Visit

## 2023-11-24 DIAGNOSIS — M6281 Muscle weakness (generalized): Secondary | ICD-10-CM

## 2023-11-24 DIAGNOSIS — R262 Difficulty in walking, not elsewhere classified: Secondary | ICD-10-CM

## 2023-11-24 DIAGNOSIS — R2681 Unsteadiness on feet: Secondary | ICD-10-CM

## 2023-11-24 NOTE — Therapy (Signed)
 OUTPATIENT PHYSICAL THERAPY TREATMENT  Patient Name: Alec White MRN: [id number] DOB:Jul 17, 1940, 83 y.o., male Today's Date: 11/24/2023  PCP: Cleotilde Oneil FALCON, MD REFERRING PROVIDER: Cleotilde Oneil FALCON, MD  END OF SESSION:  PT End of Session - 11/24/23 1625     Visit Number 9    Number of Visits 25    Date for Recertification  12/29/23    Authorization Type [employer]    Progress Note Due on Visit 10    PT Start Time 1619    PT Stop Time 1659    PT Time Calculation (min) 40 min    Equipment Utilized During Treatment Gait belt    Activity Tolerance Patient tolerated treatment well;No increased pain    Behavior During Therapy [facility] for tasks assessed/performed          No past medical history on file. 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/min (HCC) 11/25/2022   Hypothyroidism 11/25/2022   Essential hypertension 11/25/2022   Benign prostatic hyperplasia 06/10/2021   Type 2 diabetes mellitus with diabetic chronic kidney disease (HCC) 06/10/2021   Chronic gouty arthritis 04/20/2016   Hyperlipidemia, mixed 10/23/2015   CAD S/P percutaneous coronary angioplasty 02/04/2015   Diet-controlled diabetes mellitus (HCC) 02/04/2015    ONSET DATE: October 2024 is most recent REFERRING DIAG: R42 (ICD-10-CM) - Disequilibrium THERAPY DIAG:  Muscle weakness (generalized)  Difficulty in walking, not elsewhere classified  Unsteadiness on feet  Rationale for Evaluation and Treatment:  Rehabilitation  SUBJECTIVE:                                                                                                                                                                                             SUBJECTIVE STATEMENT: Pt has ordered an upwalker on [employer]. He also remains elevated in foot pain bilat. Back is alot painful now 7/10.   PERTINENT HISTORY:  Pt reports coming to PT for falls. Has had probably 4-5 falls. Pt denies any patterns with falls. Says it seems like his legs give away but he has also tripped leading to a fall posteriorly onto his tailbone which has been most recent. Pt  is able to get up on his own if he has something he can hold onto with his UE. Does have significant lumbar and cervical fusion history but is unsure if his LE are specifically weak from his prior surgeries.   PAIN:  Are you having pain? 7/10 low back pain;   PRECAUTIONS: Spine stimulator in place  WEIGHT BEARING RESTRICTIONS: No FALLS: Has patient fallen in last 6 months? Yes. Number of falls 4-5  LIVING ENVIRONMENT: Lives with: lives with their spouse Lives in: House/apartment Stairs: Yes: Internal: flight steps; on right going up and on left going up Has following equipment at home: Single point cane, Walker - 2 wheeled, shower chair, bed side commode, and Grab bars  PLOF: Independent  PATIENT GOALS: Improve his balance and his back pain some  OBJECTIVE:  Note: Objective measures were completed at evaluation unless otherwise noted.                                                                                             TREATMENT DATE 11/24/23 :   -with 5lb AW 4 laps in // bars side stepping with 4 step midway, then alternate fwd/backward 4 laps each *rest with heat  -with 5lb AW 4 laps in // bars side stepping with 4 step midway, then alternate fwd/backward 4 laps each *rest with heat -STS from chair x10, then high knee marching x30 c 5lb AW  -up and down AMB stairs c 5lb  AW, alteranting pattern: 12x up, 12x down    11/22/23 -overground AMB 429ft c clinic upwalker (supports at 3 holes from the top) 6m23s -seated back heat -overground AMB 451ft c clinic upwalker (supports at 3 holes from the top) 29m27s -seated back heat -stance on airex pad normal stance x60sec, then alternating head turns x16   11/01/23 -back pain elevated today, integrated heat to low back  -overground AMB 440ft c clinic upwalker (supports at 3 holes from the top) *rest with heat  -overground AMB 441ft c clinic upwalker (supports at 3 holes from the top) *rest with heat  -with 5lb AW 4 laps in // bars side stepping with 4 step midway, then alternate fwqd/backward 4 laps same *rest with heat  -with 5lb AW 4 laps in // bars side stepping with 4 step midway -static standing balance with ball tossing, then eyes closed firm stance  -blanace on airex pad x60 quiet seconds, then head turn with card calling out    PATIENT EDUCATION: Education details: upwalker utility, weightbearing restrictions after surgery per DR Kathlynn   Person educated: Patient and Spouse Education method: Explanation Education comprehension: verbalized understanding  HOME EXERCISE PROGRAM: Deferred to the future;  GOALS: Goals reviewed with patient? No  SHORT TERM GOALS: Target date: 11/21/23  Pt will be independent with HEP to Improve balance and LE strength to reduce falls risk.  Baseline:10/06/23: Deferred to next session Goal status: INITIAL  LONG TERM GOALS: Target date: 12/29/23  Pt will improve 5xSTS to < 14 seconds to demo adequate Le strength for age matched norms for community dwelling older adults.  Baseline: 10/06/23: 15.45 seconds  Goal status:  INITIAL  2.  Pt will improve 10 meter gait speed with LRAD to at least .8 m/s to demonstrate reduced falls risk for household and short community distances Baseline: 10/06/23: No AD: .57 m/s, with RW: .66 m/s Goal status: INITIAL  3.  Pt will improve  TUG to 12 seconds or less to demonstrate reduced falls risk with transfers and turns.  Baseline: 10/06/23: 22 seconds no AD Goal status: INITIAL  4.  Pt will improve FGA to >/= 19/30 to demonstrate clinically significant reduction in falls risk with community activities.  Baseline: 10/06/23: 12/30 Goal status: INITIAL ASSESSMENT:  CLINICAL IMPRESSION: Continued with gait-based strengthening. Pt continues to do well in // bars with heavy ankle weights. Today's session included higher volume of resistance training interventions compared to prior dates, less overground AMB training. Back pain did not appear aggravated by this shift, however asked pt to monitor for next 2 days so sessions can be adjusted as needed. Pt will benefit from skilled PT services to address these deficits to reduce falls risk and maximize independence with ADL's using LRAD.   OBJECTIVE IMPAIRMENTS: Abnormal gait, decreased balance, decreased knowledge of use of DME, decreased mobility, difficulty walking, decreased strength, decreased safety awareness, postural dysfunction, and pain.   ACTIVITY LIMITATIONS: carrying, standing, stairs, transfers, and locomotion level  PARTICIPATION LIMITATIONS: shopping and community activity  PERSONAL FACTORS: Age, Behavior pattern, Past/current experiences, Time since onset of injury/illness/exacerbation, and 3+ comorbidities: Hx of multiple spine surgeries, DM, HTN, prior hip fx, CKD are also affecting patient's functional outcome.   REHAB POTENTIAL: Fair Chronicity of symptoms  CLINICAL DECISION MAKING: Evolving/moderate complexity  EVALUATION COMPLEXITY: Moderate  PLAN: PT FREQUENCY: 1-2x/week PT DURATION: 12 weeks PLANNED INTERVENTIONS: 97164- PT Re-evaluation, 97110-Therapeutic exercises, 97530- Therapeutic activity, [id number]- Neuromuscular re-education, 97535- Self Care, [id number]- Gait training, Patient/Family education, Balance training, Stair training, and DME instructions  PLAN  FOR NEXT SESSION: Expand HEP, dynamic balance training, activity tolerance training.   4:30 PM, 11/24/23 Peggye JAYSON Linear, PT, DPT Physical Therapist - [facility]  Outpatient Physical Therapy- Main [facility] [phone]

## 2023-11-29 ENCOUNTER — Ambulatory Visit

## 2023-11-29 DIAGNOSIS — R262 Difficulty in walking, not elsewhere classified: Secondary | ICD-10-CM

## 2023-11-29 DIAGNOSIS — R2681 Unsteadiness on feet: Secondary | ICD-10-CM

## 2023-11-29 DIAGNOSIS — M6281 Muscle weakness (generalized): Secondary | ICD-10-CM

## 2023-11-29 NOTE — Therapy (Signed)
 OUTPATIENT PHYSICAL THERAPY TREATMENT Physical Therapy Progress Note Dates of reporting period  eval   to   10/14  Patient Name: Alec White MRN: [id number] DOB:1940/07/22, 83 y.o., male Today's Date: 11/29/2023  PCP: Cleotilde Oneil FALCON, MD REFERRING PROVIDER: Cleotilde Oneil FALCON, MD  END OF SESSION:  PT End of Session - 11/29/23 1405     Visit Number 10    Number of Visits 25    Date for Recertification  12/29/23    Authorization Type [employer]    Progress Note Due on Visit 10    PT Start Time 1401    PT Stop Time 1441    PT Time Calculation (min) 40 min    Equipment Utilized During Treatment Gait belt    Activity Tolerance Patient tolerated treatment well;No increased pain    Behavior During Therapy [facility] for tasks assessed/performed          No past medical history on file. 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/min (HCC) 11/25/2022   Hypothyroidism 11/25/2022   Essential hypertension 11/25/2022   Benign prostatic hyperplasia 06/10/2021   Type 2 diabetes mellitus with diabetic chronic kidney disease (HCC) 06/10/2021   Chronic gouty arthritis 04/20/2016   Hyperlipidemia, mixed 10/23/2015   CAD S/P percutaneous coronary angioplasty 02/04/2015   Diet-controlled diabetes mellitus (HCC) 02/04/2015    ONSET DATE: October 2024 is most recent REFERRING DIAG: R42 (ICD-10-CM) - Disequilibrium THERAPY DIAG:  Muscle weakness (generalized)  Difficulty in walking, not elsewhere  classified  Unsteadiness on feet  Rationale for Evaluation and Treatment: Rehabilitation  SUBJECTIVE:                                                                                                                                                                                             SUBJECTIVE STATEMENT: Pt reports he got his new upwalker but not fully assembled. Pt has been unsteady today, moreso than usual.   PERTINENT HISTORY:  Pt reports coming to PT for falls. Has had probably 4-5 falls. Pt denies any patterns with falls. Says it seems like his legs give away but he has also tripped  leading to a fall posteriorly onto his tailbone which has been most recent. Pt is able to get up on his own if he has something he can hold onto with his UE. Does have significant lumbar and cervical fusion history but is unsure if his LE are specifically weak from his prior surgeries.   PAIN:  Are you having pain? 7/10 low back pain;   PRECAUTIONS: Spine stimulator in place  WEIGHT BEARING RESTRICTIONS: No FALLS: Has patient fallen in last 6 months? Yes. Number of falls 4-5  LIVING ENVIRONMENT: Lives with: lives with their spouse Lives in: House/apartment Stairs: Yes: Internal: flight steps; on right going up and on left going up Has following equipment at home: Single point cane, Walker - 2 wheeled, shower chair, bed side commode, and Grab bars  PLOF: Independent  PATIENT GOALS: Improve his balance and his back pain some  OBJECTIVE:  Note: Objective measures were completed at evaluation unless otherwise noted.                                                                                             TREATMENT DATE 11/29/23 :   176/57mmHg 58 bpm seated on arrival 178/81mmHg 60bpm s/p 3 minutes resting -5xSTS :12.97 (1 false start), 8.9sec -363ft AMB overground:  Post AMB vitals: 160/2mmHg 66bpm post AMB -5lb AW appplied- 3 laps in // bars side stepping, then alternate fwd/backward 3  laps each (more difficulty wqith trunk control)   11/24/23 -with 5lb AW 4 laps in // bars side stepping with 4 step midway, then alternate fwd/backward 4 laps each *rest with heat  -with 5lb AW 4 laps in // bars side stepping with 4 step midway, then alternate fwd/backward 4 laps each *rest with heat -STS from chair x10, then high knee marching x30 c 5lb AW  -up and down AMB stairs c 5lb AW, alteranting pattern: 12x up, 12x down   11/22/23 -overground AMB 46ft c clinic upwalker (supports at 3 holes from the top) 64m23s -seated back heat -overground AMB 457ft c clinic upwalker (supports at 3 holes from the top) 99m27s -seated back heat -stance on airex pad normal stance x60sec, then alternating head turns x16   11/01/23 -back pain elevated today, integrated heat to low back  -overground AMB 419ft c clinic upwalker (supports at 3 holes from the top) *rest with heat  -overground AMB 434ft c clinic upwalker (supports at 3 holes from the top) *rest with heat  -with 5lb AW 4 laps in // bars side stepping with 4 step midway, then alternate fwqd/backward 4 laps same *rest with heat  -with 5lb AW 4 laps in // bars side stepping with 4 step midway -static standing balance with ball tossing, then eyes closed firm stance  -blanace on airex pad x60 quiet seconds, then head turn with card calling out    PATIENT EDUCATION: Education details: upwalker utility, weightbearing restrictions after surgery per DR Kathlynn   Person educated: Patient and Spouse Education method: Explanation Education comprehension: verbalized understanding  HOME EXERCISE PROGRAM: Deferred to the future;  GOALS: Goals reviewed with patient? No  SHORT TERM GOALS: Target  date: 11/21/23  Pt will be independent with HEP to Improve balance and LE strength to reduce falls risk.  Baseline:10/06/23: Deferred to next session Goal status: INITIAL  LONG TERM GOALS: Target date: 12/29/23  Pt will improve 5xSTS to < 14  seconds to demo adequate Le strength for age matched norms for community dwelling older adults.  Baseline: 10/06/23: 15.45 seconds; 11/29/23: 8.97sec hands free  Goal status: MET  2.  Pt will improve 10 meter gait speed with LRAD to at least .8 m/s to demonstrate reduced falls risk for household and short community distances Baseline: 10/06/23: No AD: .57 m/s, with RW: 0.66 m/s; 11/29/23: 0.22m/s Goal status: MET  3.  Pt will improve TUG to 12 seconds or less to demonstrate reduced falls risk with transfers and turns.  Baseline: 10/06/23: 22 seconds no AD;  Goal status: INITIAL  4.  Pt will improve FGA to >/= 19/30 to demonstrate clinically significant reduction in falls risk with community activities.  Baseline: 10/06/23: 12/30 Goal status: ONGOING  ASSESSMENT:  CLINICAL IMPRESSION: Pt reports doing well today, but does present with some acute difficulty. Reassessment revealing of great progress overall achievemnt of LT goals 1 and 2. Did not retest 3 and 4 given acute imbalance today. Pt will benefit from skilled PT services to address these deficits to reduce falls risk and maximize independence with ADL's using LRAD.   OBJECTIVE IMPAIRMENTS: Abnormal gait, decreased balance, decreased knowledge of use of DME, decreased mobility, difficulty walking, decreased strength, decreased safety awareness, postural dysfunction, and pain.   ACTIVITY LIMITATIONS: carrying, standing, stairs, transfers, and locomotion level  PARTICIPATION LIMITATIONS: shopping and community activity  PERSONAL FACTORS: Age, Behavior pattern, Past/current experiences, Time since onset of injury/illness/exacerbation, and 3+ comorbidities: Hx of multiple spine surgeries, DM, HTN, prior hip fx, CKD are also affecting patient's functional outcome.   REHAB POTENTIAL: Fair Chronicity of symptoms  CLINICAL DECISION MAKING: Evolving/moderate complexity  EVALUATION COMPLEXITY: Moderate  PLAN: PT FREQUENCY: 1-2x/week PT  DURATION: 12 weeks PLANNED INTERVENTIONS: 97164- PT Re-evaluation, 97110-Therapeutic exercises, 97530- Therapeutic activity, 97112- Neuromuscular re-education, [phone]- Self Care, [id number]- Gait training, Patient/Family education, Balance training, Stair training, and DME instructions  PLAN FOR NEXT SESSION: Expand HEP, dynamic balance training, activity tolerance training.   2:06 PM, 11/29/23 Peggye JAYSON Linear, PT, DPT Physical Therapist - [facility]  Outpatient Physical Therapy- Main Campus [phone]

## 2023-12-01 ENCOUNTER — Ambulatory Visit

## 2023-12-01 DIAGNOSIS — M6281 Muscle weakness (generalized): Secondary | ICD-10-CM | POA: Diagnosis not present

## 2023-12-01 DIAGNOSIS — R262 Difficulty in walking, not elsewhere classified: Secondary | ICD-10-CM

## 2023-12-01 DIAGNOSIS — R2681 Unsteadiness on feet: Secondary | ICD-10-CM

## 2023-12-01 NOTE — Therapy (Signed)
 OUTPATIENT PHYSICAL THERAPY TREATMENT  Patient Name: Alec White MRN: [id number] DOB:1941/02/05, 83 y.o., male Today's Date: 12/01/2023  PCP: Cleotilde Oneil FALCON, MD REFERRING PROVIDER: Cleotilde Oneil FALCON, MD  END OF SESSION:  PT End of Session - 12/01/23 1513     Visit Number 11    Number of Visits 25    Date for Recertification  12/29/23    Authorization Type [employer]    Progress Note Due on Visit 20    PT Start Time 1450    PT Stop Time 1530    PT Time Calculation (min) 40 min    Equipment Utilized During Treatment Gait belt    Activity Tolerance Patient tolerated treatment well;No increased pain    Behavior During Therapy [facility] for tasks assessed/performed          No past medical history on file. 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/min (HCC) 11/25/2022   Hypothyroidism 11/25/2022   Essential hypertension 11/25/2022   Benign prostatic hyperplasia 06/10/2021   Type 2 diabetes mellitus with diabetic chronic kidney disease (HCC) 06/10/2021   Chronic gouty arthritis 04/20/2016   Hyperlipidemia, mixed 10/23/2015   CAD S/P percutaneous coronary angioplasty 02/04/2015   Diet-controlled diabetes mellitus (HCC) 02/04/2015    ONSET DATE: October 2024 is most recent REFERRING DIAG: R42 (ICD-10-CM) - Disequilibrium THERAPY DIAG:  Muscle weakness (generalized)  Difficulty in walking, not elsewhere classified  Unsteadiness on feet  Rationale for Evaluation and Treatment:  Rehabilitation  SUBJECTIVE:                                                                                                                                                                                             SUBJECTIVE STATEMENT: Pt reports he fells about th esmae. Bring sin his upwalker today. It's red.   PERTINENT HISTORY:  Pt reports coming to PT for falls. Has had probably 4-5 falls. Pt denies any patterns with falls. Says it seems like his legs give away but he has also tripped leading to a fall posteriorly onto his tailbone which has been most recent. Pt is able to get up on his  own if he has something he can hold onto with his UE. Does have significant lumbar and cervical fusion history but is unsure if his LE are specifically weak from his prior surgeries.   PAIN:  Are you having pain? 7/10 low back pain;    PRECAUTIONS: Spine stimulator in place  WEIGHT BEARING RESTRICTIONS: No FALLS: Has patient fallen in last 6 months? Yes. Number of falls 4-5  LIVING ENVIRONMENT: Lives with: lives with their spouse Lives in: House/apartment Stairs: Yes: Internal: flight steps; on right going up and on left going up Has following equipment at home: Single point cane, Walker - 2 wheeled, shower chair, bed side commode, and Grab bars  PLOF: Independent  PATIENT GOALS: Improve his balance and his back pain some  OBJECTIVE:  Note: Objective measures were completed at evaluation unless otherwise noted.                                                                                             TREATMENT DATE 12/01/23 :   -overground AMB with new upwalker, arms on setting 4, then adjusted to 6, eventually 7: 7 minutes or ~830ft Sit break on upwalker -4 minutes AMB back to rehab gym with upwalker   -STS for chair hands free 2x12   -with 5lb AW lateral stepping 6x60ft bilat in // bars with 2 SPC lying on floor  -with 5lb AW forward stepping and backward stepping 6x each in // bars    11/29/23 176/44mmHg 58 bpm seated on arrival 178/73mmHg 60bpm s/p 3 minutes resting -5xSTS :12.97 (1 false start), 8.9sec -364ft AMB overground:  Post AMB vitals: 160/79mmHg 66bpm post AMB -5lb AW appplied- 3 laps in // bars side stepping, then alternate fwd/backward 3 laps each (more difficulty wqith trunk control)   11/24/23 -with 5lb AW 4 laps in // bars side stepping with 4 step midway, then alternate fwd/backward 4 laps each *rest with heat  -with 5lb AW 4 laps in // bars side stepping with 4 step midway, then alternate fwd/backward 4 laps each *rest with heat -STS from chair x10, then high knee marching x30 c 5lb AW  -up and down AMB stairs c 5lb AW, alteranting pattern: 12x up, 12x down   11/22/23 -overground AMB 456ft c clinic upwalker (supports at 3 holes from the top) 46m23s -seated back heat -overground AMB 468ft c clinic upwalker (supports at 3 holes from the top) 25m27s -seated back heat -stance on airex pad normal stance x60sec, then alternating head turns x16   11/01/23 -back pain elevated today, integrated heat to low back  -overground AMB 481ft c clinic upwalker (supports at 3 holes from the top) *rest with heat  -overground AMB 455ft c clinic upwalker (supports at 3 holes from the top) *rest with heat  -with 5lb AW 4 laps in // bars side stepping with 4 step midway, then alternate fwqd/backward 4 laps same *rest with heat  -with 5lb AW 4 laps in // bars side stepping with 4 step midway -static standing balance with ball tossing, then eyes closed firm stance  -blanace on airex pad x60 quiet seconds, then head  turn with card calling out    PATIENT EDUCATION: Education details: upwalker utility, weightbearing restrictions after surgery per DR Kathlynn  Person educated: Patient and Spouse Education method: Explanation Education comprehension: verbalized understanding  HOME EXERCISE PROGRAM: Deferred to the future;  GOALS: Goals reviewed with patient?  No  SHORT TERM GOALS: Target date: 11/21/23  Pt will be independent with HEP to Improve balance and LE strength to reduce falls risk.  Baseline:10/06/23: Deferred to next session Goal status: INITIAL  LONG TERM GOALS: Target date: 12/29/23  Pt will improve 5xSTS to < 14 seconds to demo adequate Le strength for age matched norms for community dwelling older adults.  Baseline: 10/06/23: 15.45 seconds; 11/29/23: 8.97sec hands free  Goal status: MET  2.  Pt will improve 10 meter gait speed with LRAD to at least .8 m/s to demonstrate reduced falls risk for household and short community distances Baseline: 10/06/23: No AD: .57 m/s, with RW: 0.66 m/s; 11/29/23: 0.34m/s Goal status: MET  3.  Pt will improve TUG to 12 seconds or less to demonstrate reduced falls risk with transfers and turns.  Baseline: 10/06/23: 22 seconds no AD;  Goal status: INITIAL  4.  Pt will improve FGA to >/= 19/30 to demonstrate clinically significant reduction in falls risk with community activities.  Baseline: 10/06/23: 12/30 Goal status: ONGOING  ASSESSMENT:  CLINICAL IMPRESSION: Pt arrives with new up walker, able to attempt a much longer distance walk today showing outstanding improvements mobility tolerance. Pt continues to be somewhat less energetic, but does well overall with rest breaks and heat to low back. Pt will benefit from skilled PT services to address these deficits to reduce falls risk and maximize independence with ADL's using LRAD.   OBJECTIVE IMPAIRMENTS: Abnormal gait, decreased balance, decreased knowledge of use of DME, decreased mobility, difficulty walking, decreased strength, decreased safety awareness, postural dysfunction, and pain.   ACTIVITY LIMITATIONS: carrying, standing, stairs, transfers, and locomotion level  PARTICIPATION LIMITATIONS: shopping and community activity  PERSONAL FACTORS: Age, Behavior pattern, Past/current experiences, Time since onset of injury/illness/exacerbation,  and 3+ comorbidities: Hx of multiple spine surgeries, DM, HTN, prior hip fx, CKD are also affecting patient's functional outcome.   REHAB POTENTIAL: Fair Chronicity of symptoms  CLINICAL DECISION MAKING: Evolving/moderate complexity  EVALUATION COMPLEXITY: Moderate  PLAN: PT FREQUENCY: 1-2x/week PT DURATION: 12 weeks PLANNED INTERVENTIONS: 97164- PT Re-evaluation, 97110-Therapeutic exercises, 97530- Therapeutic activity, [id number]- Neuromuscular re-education, 97535- Self Care, [id number]- Gait training, Patient/Family education, Balance training, Stair training, and DME instructions  PLAN FOR NEXT SESSION: Expand HEP, dynamic balance training, activity tolerance training.   3:16 PM, 12/01/23 Peggye JAYSON Linear, PT, DPT Physical Therapist - [facility]  Outpatient Physical Therapy- Main [facility] [phone]

## 2023-12-06 ENCOUNTER — Ambulatory Visit

## 2023-12-08 ENCOUNTER — Emergency Department

## 2023-12-08 ENCOUNTER — Ambulatory Visit

## 2023-12-08 ENCOUNTER — Emergency Department
Admission: EM | Admit: 2023-12-08 | Discharge: 2023-12-09 | Disposition: A | Discharge status: Home / Self Care | Attending: Emergency Medicine | Admitting: Emergency Medicine

## 2023-12-08 DIAGNOSIS — M6281 Muscle weakness (generalized): Secondary | ICD-10-CM

## 2023-12-08 DIAGNOSIS — W01198A Fall on same level from slipping, tripping and stumbling with subsequent striking against other object, initial encounter: Secondary | ICD-10-CM | POA: Diagnosis not present

## 2023-12-08 DIAGNOSIS — W19XXXA Unspecified fall, initial encounter: Secondary | ICD-10-CM

## 2023-12-08 DIAGNOSIS — S065XAA Traumatic subdural hemorrhage with loss of consciousness status unknown, initial encounter: Secondary | ICD-10-CM | POA: Diagnosis not present

## 2023-12-08 DIAGNOSIS — I10 Essential (primary) hypertension: Secondary | ICD-10-CM | POA: Insufficient documentation

## 2023-12-08 DIAGNOSIS — R2681 Unsteadiness on feet: Secondary | ICD-10-CM

## 2023-12-08 DIAGNOSIS — R262 Difficulty in walking, not elsewhere classified: Secondary | ICD-10-CM

## 2023-12-08 DIAGNOSIS — S0990XA Unspecified injury of head, initial encounter: Secondary | ICD-10-CM

## 2023-12-08 LAB — CBC WITH DIFFERENTIAL/PLATELET
Abs Immature Granulocytes: 0.02 K/uL (ref 0.00–0.07)
Basophils Absolute: 0 K/uL (ref 0.0–0.1)
Basophils Relative: 1 %
Eosinophils Absolute: 0.3 K/uL (ref 0.0–0.5)
Eosinophils Relative: 6 %
HCT: 35.6 % — ABNORMAL LOW (ref 39.0–52.0)
Hemoglobin: 11.8 g/dL — ABNORMAL LOW (ref 13.0–17.0)
Immature Granulocytes: 0 %
Lymphocytes Relative: 24 %
Lymphs Abs: 1.4 K/uL (ref 0.7–4.0)
MCH: 31.2 pg (ref 26.0–34.0)
MCHC: 33.1 g/dL (ref 30.0–36.0)
MCV: 94.2 fL (ref 80.0–100.0)
Monocytes Absolute: 0.6 K/uL (ref 0.1–1.0)
Monocytes Relative: 10 %
Neutro Abs: 3.6 K/uL (ref 1.7–7.7)
Neutrophils Relative %: 59 %
Platelets: 108 K/uL — ABNORMAL LOW (ref 150–400)
RBC: 3.78 MIL/uL — ABNORMAL LOW (ref 4.22–5.81)
RDW: 13.2 % (ref 11.5–15.5)
WBC: 6 K/uL (ref 4.0–10.5)
nRBC: 0 % (ref 0.0–0.2)

## 2023-12-08 LAB — BASIC METABOLIC PANEL WITH GFR
Anion gap: 9 (ref 5–15)
BUN: 50 mg/dL — ABNORMAL HIGH (ref 8–23)
CO2: 22 mmol/L (ref 22–32)
Calcium: 8.8 mg/dL — ABNORMAL LOW (ref 8.9–10.3)
Chloride: 109 mmol/L (ref 98–111)
Creatinine, Ser: 3.86 mg/dL — ABNORMAL HIGH (ref 0.61–1.24)
GFR, Estimated: 15 mL/min — ABNORMAL LOW (ref >60)
Glucose, Bld: 132 mg/dL — ABNORMAL HIGH (ref 70–99)
Potassium: 4 mmol/L (ref 3.5–5.1)
Sodium: 140 mmol/L (ref 135–145)

## 2023-12-08 LAB — PROTIME-INR
INR: 1.1 (ref 0.8–1.2)
Prothrombin Time: 14.7 s (ref 11.4–15.2)

## 2023-12-08 MED ORDER — ACETAMINOPHEN 500 MG PO TABS
1000.0000 mg | ORAL_TABLET | Freq: Once | ORAL | Status: AC
  Administered 2023-12-08: 1000 mg via ORAL
  Filled 2023-12-08: qty 2

## 2023-12-08 NOTE — ED Notes (Signed)
 Fall bundle established at this time. Patient is alert and oriented, able to follow instructions. Patient instructed not to get out of bed without staff at bedside. Urinal also given to patient.

## 2023-12-08 NOTE — Therapy (Signed)
 OUTPATIENT PHYSICAL THERAPY TREATMENT  Patient Name: Alec White MRN: [id number] DOB:17-Nov-1940, 83 y.o., male Today's Date: 12/08/2023  PCP: Cleotilde Oneil FALCON, MD REFERRING PROVIDER: Cleotilde Oneil FALCON, MD  END OF SESSION:  PT End of Session - 12/08/23 1330     Visit Number 12    Number of Visits 25    Date for Recertification  12/29/23    Authorization Type [employer]    Progress Note Due on Visit 20    PT Start Time 1318    PT Stop Time 1358    PT Time Calculation (min) 40 min    Equipment Utilized During Treatment Gait belt    Activity Tolerance Patient tolerated treatment well;No increased pain    Behavior During Therapy [facility] for tasks assessed/performed          No past medical history on file. 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/min (HCC) 11/25/2022   Hypothyroidism 11/25/2022   Essential hypertension 11/25/2022   Benign prostatic hyperplasia 06/10/2021   Type 2 diabetes mellitus with diabetic chronic kidney disease (HCC) 06/10/2021   Chronic gouty arthritis 04/20/2016   Hyperlipidemia, mixed 10/23/2015   CAD S/P percutaneous coronary angioplasty 02/04/2015   Diet-controlled diabetes mellitus (HCC) 02/04/2015    ONSET DATE: October 2024 is most recent REFERRING DIAG: R42 (ICD-10-CM) - Disequilibrium THERAPY DIAG:  Muscle weakness (generalized)  Difficulty in walking, not elsewhere classified  Unsteadiness on feet  Rationale for Evaluation and Treatment:  Rehabilitation  SUBJECTIVE:                                                                                                                                                                                             SUBJECTIVE STATEMENT: Pt doing ok today. Saw eye doctor yesterday. Also got 3 vaccines.   PERTINENT HISTORY:  Pt reports coming to PT for falls. Has had probably 4-5 falls. Pt denies any patterns with falls. Says it seems like his legs give away but he has also tripped leading to a fall posteriorly onto his tailbone which has been most recent. Pt is able to get up on his own if  he has something he can hold onto with his UE. Does have significant lumbar and cervical fusion history but is unsure if his LE are specifically weak from his prior surgeries.   PAIN:  Are you having pain? 7/10 low back pain;    PRECAUTIONS: Spine stimulator in place  WEIGHT BEARING RESTRICTIONS: No FALLS: Has patient fallen in last 6 months? Yes. Number of falls 4-5  LIVING ENVIRONMENT: Lives with: lives with their spouse Lives in: House/apartment Stairs: Yes: Internal: flight steps; on right going up and on left going up Has following equipment at home: Single point cane, Walker - 2 wheeled, shower chair, bed side commode, and Grab bars  PLOF: Independent  PATIENT GOALS: Improve his balance and his back pain some  OBJECTIVE:  Note: Objective measures were completed at evaluation unless otherwise noted.                                                                                             TREATMENT DATE 12/08/23 :   -621ft in 53m49s, 2lb AW  -675ft in 14m59s, 2lb AW -356ft, 4lb AW bilat, up walker, fastest pace (67m29s)  -358ft, GTB around knees, up walker, fastest pace (44m28s)    PATIENT EDUCATION: Education details: modified workouts since Person educated: Patient and Spouse Education method: Explanation Education comprehension: verbalized understanding  HOME EXERCISE  PROGRAM: Deferred to the future;  GOALS: Goals reviewed with patient? No  SHORT TERM GOALS: Target date: 11/21/23  Pt will be independent with HEP to Improve balance and LE strength to reduce falls risk.  Baseline:10/06/23: Deferred to next session Goal status: INITIAL  LONG TERM GOALS: Target date: 12/29/23  Pt will improve 5xSTS to < 14 seconds to demo adequate Le strength for age matched norms for community dwelling older adults.  Baseline: 10/06/23: 15.45 seconds; 11/29/23: 8.97sec hands free  Goal status: MET  2.  Pt will improve 10 meter gait speed with LRAD to at least .8 m/s to demonstrate reduced falls risk for household and short community distances Baseline: 10/06/23: No AD: .57 m/s, with RW: 0.66 m/s; 11/29/23: 0.61m/s Goal status: MET  3.  Pt will improve TUG to 12 seconds or less to demonstrate reduced falls risk with transfers and turns.  Baseline: 10/06/23: 22 seconds no AD;  Goal status: INITIAL  4.  Pt will improve FGA to >/= 19/30 to demonstrate clinically significant reduction in falls risk with community activities.  Baseline: 10/06/23: 12/30 Goal status: ONGOING  ASSESSMENT:  CLINICAL IMPRESSION: Pt with additional back pain today, but able to partake and some higher intensity gait with upwalker. Continued to use heat during rest breaks. Pt recovers nicely between efforts. Pt will benefit from skilled PT services to address these deficits to reduce falls risk and maximize independence with ADL's using LRAD.   OBJECTIVE IMPAIRMENTS: Abnormal gait, decreased balance, decreased knowledge of use of DME, decreased mobility, difficulty walking, decreased strength, decreased safety awareness, postural dysfunction, and pain.   ACTIVITY LIMITATIONS: carrying, standing, stairs, transfers, and locomotion level  PARTICIPATION LIMITATIONS: shopping and community activity  PERSONAL FACTORS: Age, Behavior pattern, Past/current experiences, Time since onset of  injury/illness/exacerbation, and 3+ comorbidities: Hx of multiple spine surgeries, DM, HTN, prior hip fx, CKD are also affecting patient's functional outcome.   REHAB POTENTIAL: Fair Chronicity of symptoms  CLINICAL DECISION MAKING: Evolving/moderate complexity  EVALUATION COMPLEXITY: Moderate  PLAN: PT FREQUENCY: 1-2x/week PT DURATION: 12 weeks PLANNED INTERVENTIONS: 97164- PT Re-evaluation, 97110-Therapeutic exercises, 97530- Therapeutic activity, [id number]- Neuromuscular re-education, 97535- Self Care, 02883- Gait training, Patient/Family education, Balance training, Stair training, and DME instructions  PLAN FOR NEXT SESSION: Expand HEP, dynamic balance training, activity tolerance training.   1:42 PM, 12/08/23 Peggye JAYSON Linear, PT, DPT Physical Therapist - [facility]  Outpatient Physical Therapy- Main Campus [phone]

## 2023-12-08 NOTE — ED Triage Notes (Signed)
 Pt arrived to ED d/t fall after pt got up to walk to dinner table. Pt stated that he tripped over his cane and fell forward on to hard wood floor, hitting forehead on floor. Pt arrives with hematoma to right forehead. Pt takes aspirin  81 mg nightly and did not lose consciousness. Pt states he does not have any dizziness/blurred vision/ or headache.

## 2023-12-08 NOTE — ED Provider Notes (Signed)
 [facility] Provider Note    Event Date/Time   First MD Initiated Contact with Patient 12/08/23 2156     (approximate)   History   Fall   HPI  Alec White is a 83 y.o. male past medical history significant for hypertension who presents to the emergency department following a fall.  States that he got up to go to dinner and tripped on his cane falling forward and hitting his head.  Denies loss of consciousness.  Complaining of a mild headache to the front of his head.  Denies change in vision, extremity numbness or weakness, neck pain or slurring of speech.  Takes 81 mg of aspirin  daily but no other anticoagulation or antiplatelet agents.  Denies any preceding symptoms of chest pain, shortness of breath, nausea vomiting or lightheadedness.     Physical Exam   Triage Vital Signs: ED Triage Vitals  Encounter Vitals Group     BP 12/08/23 2107 (!) 194/77     Girls Systolic BP Percentile --      Girls Diastolic BP Percentile --      Boys Systolic BP Percentile --      Boys Diastolic BP Percentile --      Pulse Rate 12/08/23 2107 71     Resp 12/08/23 2107 18     Temp 12/08/23 2107 98.5 F (36.9 C)     Temp Source 12/08/23 2107 Oral     SpO2 12/08/23 2107 100 %     Weight 12/08/23 2108 184 lb (83.5 kg)     Height 12/08/23 2108 5' 7 (1.702 m)     Head Circumference --      Peak Flow --      Pain Score 12/08/23 2107 2     Pain Loc --      Pain Education --      Exclude from Growth Chart --     Most recent vital signs: Vitals:   12/08/23 2300 12/09/23 0030  BP: (!) 217/80 (!) 186/82  Pulse: 66 (!) 59  Resp: 20 (!) 9  Temp:    SpO2: 98% 99%    Physical Exam Constitutional:      Appearance: He is well-developed.  HENT:     Head:     Comments: Hematoma to the right forehead    Right Ear: External ear normal.     Left Ear: External ear normal.  Eyes:     Conjunctiva/sclera: Conjunctivae normal.  Cardiovascular:     Rate and Rhythm:  Regular rhythm.  Pulmonary:     Effort: No respiratory distress.  Musculoskeletal:        General: No swelling or tenderness. Normal range of motion.     Cervical back: Normal range of motion. No tenderness.  Skin:    General: Skin is warm.  Neurological:     Mental Status: He is alert. Mental status is at baseline.     GCS: GCS eye subscore is 4. GCS verbal subscore is 5. GCS motor subscore is 6.     Cranial Nerves: Cranial nerves 2-12 are intact.     Sensory: Sensation is intact.     Motor: Motor function is intact.     Coordination: Coordination is intact.     IMPRESSION / MDM / ASSESSMENT AND PLAN / ED COURSE  I reviewed the triage vital signs and the nursing notes.  Differential diagnosis including intracranial hemorrhage, concussion, electrolyte abnormality, cervical spine fracture.  No signs or symptoms consistent  with basilar skull fracture.  Called by radiology and informed of the CT scan of the head with a subdural hematoma with 4 mm and no midline shift  Hypertensive but afebrile   EKG  I, Clotilda Punter, the attending physician, personally viewed and interpreted this ECG.   Rate: Normal  Rhythm: Normal sinus  Axis: Normal  Intervals: Normal  ST&T Change: None  No tachycardic or bradycardic dysrhythmias while on cardiac telemetry.  RADIOLOGY Discussed CT scan of the head with radiologist.  4 mm right temporal parietal subdural hematoma with no mass effect or midline shift.  Scalp hematoma. LABS (all labs ordered are listed, but only abnormal results are displayed) Labs interpreted as -    Labs Reviewed  CBC WITH DIFFERENTIAL/PLATELET - Abnormal; Notable for the following components:      Result Value   RBC 3.78 (*)    Hemoglobin 11.8 (*)    HCT 35.6 (*)    Platelets 108 (*)    All other components within normal limits  BASIC METABOLIC PANEL WITH GFR - Abnormal; Notable for the following components:   Glucose, Bld 132 (*)    BUN 50 (*)    Creatinine,  Ser 3.86 (*)    Calcium  8.8 (*)    GFR, Estimated 15 (*)    All other components within normal limits  PROTIME-INR     MDM  Creatinine elevated at 3.86 from a baseline of 2.6 does have a mildly elevated BUN of 50.  No other significant electrolyte abnormalities.  Given IV fluids.  Clinical Course as of 12/09/23 0101  Thu Dec 08, 2023  2154 Called by radiologist for CT scan of the head with a subdural hematoma with no midline shift and a negative C-spine.  Notified charge nurse and will work on getting a room available. [SM]    Clinical Course User Index [SM] Punter Clotilda, MD   Patient found to have a subdural hematoma with no midline shift.  GCS of 15 with no concern for basilar skull fracture.  Not on anticoagulation.  Nonfocal neurologic exam.  Consulted neurosurgery, not yet heard back from neurosurgery.  Repaging.  Ordered a 6-hour repeat CT scan of the head.  Care transferred to incoming provider with plans to follow-up with neurosurgery.   PROCEDURES:  Critical Care performed: yes  .Critical Care  Performed by: Punter Clotilda, MD Authorized by: Punter Clotilda, MD   Critical care provider statement:    Critical care time (minutes):  35   Critical care time was exclusive of:  Separately billable procedures and treating other patients   Critical care was necessary to treat or prevent imminent or life-threatening deterioration of the following conditions:  CNS failure or compromise   Critical care was time spent personally by me on the following activities:  Development of treatment plan with patient or surrogate, discussions with consultants, evaluation of patient's response to treatment, examination of patient, ordering and review of laboratory studies, ordering and review of radiographic studies, ordering and performing treatments and interventions, pulse oximetry, re-evaluation of patient's condition and review of old charts   Patient's presentation is most consistent  with acute presentation with potential threat to life or bodily function.   MEDICATIONS ORDERED IN ED: Medications  sodium chloride  0.9 % bolus 1,000 mL (has no administration in time range)  acetaminophen  (TYLENOL ) tablet 1,000 mg (1,000 mg Oral Given 12/08/23 2233)    FINAL CLINICAL IMPRESSION(S) / ED DIAGNOSES   Final diagnoses:  Injury of head, initial encounter  Fall, initial encounter  Subdural hematoma (HCC)     Rx / DC Orders   ED Discharge Orders     None        Note:  This document was prepared using Dragon voice recognition software and may include unintentional dictation errors.   Suzanne Kirsch, MD 12/09/23 [phone]

## 2023-12-09 ENCOUNTER — Telehealth: Payer: Self-pay | Admitting: Neurosurgery

## 2023-12-09 ENCOUNTER — Emergency Department

## 2023-12-09 DIAGNOSIS — S065XAA Traumatic subdural hemorrhage with loss of consciousness status unknown, initial encounter: Secondary | ICD-10-CM | POA: Diagnosis not present

## 2023-12-09 MED ORDER — SODIUM CHLORIDE 0.9 % IV BOLUS
1000.0000 mL | Freq: Once | INTRAVENOUS | Status: AC
  Administered 2023-12-09: 1000 mL via INTRAVENOUS

## 2023-12-09 NOTE — ED Provider Notes (Signed)
 Procedures  Clinical Course as of 12/09/23 0449  Thu Dec 08, 2023  2154 Called by radiologist for CT scan of the head with a subdural hematoma with no midline shift and a negative C-spine.  Notified charge nurse and will work on getting a room available. [SM]    Clinical Course User Index [SM] Suzanne Kirsch, MD    ----------------------------------------- 4:49 AM on 12/09/2023 -----------------------------------------  Repeat CT head stable.  Discussed with neurosurgery who advises patient can follow-up in clinic in 2 to 4 weeks.    Viviann Pastor, MD 12/09/23 [phone]

## 2023-12-09 NOTE — Discharge Instructions (Signed)
 Your CT scan of the head shows a small amount bleeding in the skull from the initial fall.  A repeat scan shows this bleeding has stopped.  You should avoid strenuous activity and make an appointment with Neurosurgery in 2-4 weeks for follow up evaluation.

## 2023-12-09 NOTE — Telephone Encounter (Signed)
 Patient was seen in the ER for a subdural hematoma. Does he need to have repeat imaging? Please advise.

## 2023-12-09 NOTE — Telephone Encounter (Signed)
 Returned pt vm for additional questions. Pt confirmed he wanted to inquire about the CT scan & if a repeat one is needed.

## 2023-12-12 ENCOUNTER — Other Ambulatory Visit: Payer: Self-pay | Admitting: Family Medicine

## 2023-12-12 DIAGNOSIS — S065XAA Traumatic subdural hemorrhage with loss of consciousness status unknown, initial encounter: Secondary | ICD-10-CM

## 2023-12-12 NOTE — Telephone Encounter (Signed)
 Contacted pt and informed. Will c/b after ct is scheduled

## 2023-12-13 ENCOUNTER — Ambulatory Visit: Admitting: Physical Therapy

## 2023-12-15 ENCOUNTER — Ambulatory Visit

## 2023-12-15 NOTE — Telephone Encounter (Signed)
 Left message with patient's wife that CT was scheduled too soon. Patient to call our office back.

## 2023-12-16 ENCOUNTER — Ambulatory Visit

## 2023-12-16 NOTE — Telephone Encounter (Signed)
 CT scheduled for 01/02/2024 and appointment with our office on 01/16/2024.

## 2023-12-20 ENCOUNTER — Ambulatory Visit

## 2023-12-22 ENCOUNTER — Ambulatory Visit

## 2023-12-27 ENCOUNTER — Ambulatory Visit

## 2023-12-29 ENCOUNTER — Ambulatory Visit

## 2024-01-02 ENCOUNTER — Ambulatory Visit
Admission: RE | Admit: 2024-01-02 | Discharge: 2024-01-02 | Disposition: A | Discharge status: Home / Self Care | Source: Ambulatory Visit | Attending: Neurosurgery | Admitting: Neurosurgery

## 2024-01-02 DIAGNOSIS — S065XAA Traumatic subdural hemorrhage with loss of consciousness status unknown, initial encounter: Secondary | ICD-10-CM | POA: Diagnosis present

## 2024-01-03 ENCOUNTER — Ambulatory Visit

## 2024-01-05 ENCOUNTER — Ambulatory Visit

## 2024-01-06 NOTE — Progress Notes (Signed)
 Referring Physician:  Cleotilde Oneil FALCON, MD [address] [facility] [place],  [place] [address]  Primary Physician:  Cleotilde Oneil FALCON, MD  History of Present Illness: 01/16/2024 [name] is here for f/u on subdural hematoma that occurred on 12/08/23 after he tripped on his cane and fell forward hitting his head. Overall he is doing very well. He has not had any new falls or had any new symptoms since the fall.  No headaches, dizziness, changes to his vision.  He does note chronic neck and back pain and does have a history of previous surgeries, but this is unchanged.    Past Surgery:  Previous cervical spine fusion and lumbar spine fusion  The symptoms are causing a significant impact on the patient's life.   Review of Systems:  A 10 point review of systems is negative, except for the pertinent positives and negatives detailed in the HPI.  Past Medical History: No past medical history on file.  Past Surgical History: Past Surgical History:  Procedure Laterality Date   HIP PINNING,CANNULATED Left 11/26/2022   Procedure: PERCUTANEOUS FIXATION OF FEMORAL NECK;  Surgeon: Tobie Priest, MD;  Location: [facility] ORS;  Service: Orthopedics;  Laterality: Left;   spinal neurostimulator  08/31/2017     Posterior lumbar decompression and fusion L1-S1, 01/2015   Allergies: Allergies as of 01/16/2024 - Review Complete 01/16/2024  Allergen Reaction Noted   Tramadol  Other (See Comments) 07/21/2016    Medications: Outpatient Encounter Medications as of 01/16/2024  Medication Sig   acetaminophen  (TYLENOL ) 325 MG tablet Take 1-2 tablets (325-650 mg total) by mouth every 6 (six) hours as needed for mild pain (pain score 1-3 or temp > 100.5).   allopurinol  (ZYLOPRIM ) 100 MG tablet Take 100 mg by mouth daily.   amLODipine  (NORVASC ) 5 MG tablet Take 5 mg by mouth daily.   aspirin  EC 81 MG tablet Take 1 tablet (81 mg total) by mouth 2 (two) times daily. Swallow  whole.   atorvastatin  (LIPITOR) 40 MG tablet Take 40 mg by mouth daily.   buprenorphine (BUTRANS) 10 MCG/HR PTWK Place 1 patch onto the skin once a week.   cholecalciferol (VITAMIN D3) 25 MCG (1000 UNIT) tablet Take 5,000 Units by mouth daily.   empagliflozin  (JARDIANCE ) 10 MG TABS tablet Take 10 mg by mouth daily.   feeding supplement (ENSURE ENLIVE / ENSURE PLUS) LIQD Take 237 mLs by mouth 2 (two) times daily between meals.   finasteride (PROSCAR) 5 MG tablet Take 5 mg by mouth daily.   levothyroxine  (SYNTHROID ) 200 MCG tablet Take 200 mcg by mouth daily before breakfast.   metoprolol  succinate (TOPROL -XL) 100 MG 24 hr tablet Take 100 mg by mouth daily. Take with or immediately following a meal.   oxyCODONE  (OXY IR/ROXICODONE ) 5 MG immediate release tablet Take 1 tablet (5 mg total) by mouth every 6 (six) hours as needed for severe pain.   polyethylene glycol (MIRALAX  / GLYCOLAX ) 17 g packet Take 17 g by mouth daily.   tamsulosin (FLOMAX) 0.4 MG CAPS capsule Take 0.4 mg by mouth.   timolol  (BETIMOL ) 0.5 % ophthalmic solution Place 1 drop into both eyes daily.   No facility-administered encounter medications on file as of 01/16/2024.    Social History: Social History   Tobacco Use   Smoking status: Never   Smokeless tobacco: Never  Substance Use Topics   Alcohol use: Not Currently    Family Medical History: No family history on file.  Physical Examination: @  VITALWITHPAIN@  General: Patient is well developed, well nourished, calm, collected, and in no apparent distress. Attention to examination is appropriate.  Psychiatric: Patient is non-anxious.  Head:  Pupils equal, round, and reactive to light.  ENT:  Oral mucosa appears well hydrated.  Neck:   Supple.    Respiratory: Patient is breathing without any difficulty.  Extremities: No edema.  Vascular: Palpable dorsal pedal pulses.  Skin:   On exposed skin, there are no abnormal skin lesions.  NEUROLOGICAL:      Awake, alert, oriented to person, place, and time.  Speech is clear and fluent. Fund of knowledge is appropriate.   Cranial Nerves: Pupils equal round and reactive to light.  Facial tone is symmetric.  No pronator drift.    Strength: Side Biceps Triceps Deltoid Interossei Grip Wrist Ext. Wrist Flex.  R 5 5 5 5 5 5 5   L 5 5 5 5 5 5 5    Side Iliopsoas Quads Hamstring PF DF EHL  R 5 5 5 5 5 5   L 5 5 5 5 5 5     Patient is ambulating with a cane.  Medical Decision Making  Imaging: EXAM: CT HEAD WITHOUT CONTRAST 01/02/2024 12:19:09 PM   TECHNIQUE: CT of the head was performed without the administration of intravenous contrast. Automated exposure control, iterative reconstruction, and/or weight based adjustment of the mA/kV was utilized to reduce the radiation dose to as low as reasonably achievable.   COMPARISON: 12/09/2023   CLINICAL HISTORY: Sudural Hematoma   FINDINGS:   BRAIN AND VENTRICLES: Diminished right cerebral convexity subdural hematoma, now low-density, maximal thickness 3 mm. Minimal mass effect. Generalized cerebral volume loss. Vertebrobasilar tortuosity and calcification. No evidence of acute infarct. No hydrocephalus. No midline shift.   ORBITS: No acute abnormality.   SINUSES: No acute abnormality.   SOFT TISSUES AND SKULL: No acute soft tissue abnormality. No skull fracture.   IMPRESSION: 1. Diminished right cerebral convexity subdural hematoma, now low-density, maximal thickness 3 mm, with minimal mass effect. 2. Generalized cerebral volume loss.  I have personally reviewed the images and agree with the above interpretation.  Assessment and Plan: [name] is a pleasant 83 y.o. male is here for subdural hematoma that occurred on 12/08/23 after he tripped on his cane and fell forward hitting his head. Overall he is doing very well. He has not had any new falls or had any new symptoms since the fall.  No headaches, dizziness, changes to his  vision.  On examination he is full strength.  Pupils are equal, round, reactive to light.  No pronator drift.  CT scan shows diminished right subdural hematoma.  At this point, no plans for repeat head CT.  Advised patient to continue with physical therapy to help work on his balance.  We discussed fall risk and trying to minimize this at all cost.  Red flag symptoms were reviewed at length and when she should go to the nearest emergency department.    Thank you for involving me in the care of this patient.   I spent a total of 30 minutes in both face-to-face and non-face-to-face activities for this visit on the date of this encounter including preparing to see the patient, obtaining and reviewing separately obtained history, performing medically appropriate examination, counseling the patient,  documenting clinical information, independently interpreting results, coordination of care.   Lyle Decamp, PA-C Dept. of Neurosurgery

## 2024-01-10 ENCOUNTER — Ambulatory Visit

## 2024-01-16 ENCOUNTER — Encounter: Payer: Self-pay | Admitting: Physician Assistant

## 2024-01-16 ENCOUNTER — Ambulatory Visit: Admitting: Physician Assistant

## 2024-01-16 VITALS — BP 130/82 | Ht 67.0 in | Wt 184.0 lb

## 2024-01-16 DIAGNOSIS — S065XAA Traumatic subdural hemorrhage with loss of consciousness status unknown, initial encounter: Secondary | ICD-10-CM | POA: Diagnosis not present

## 2024-01-16 DIAGNOSIS — W19XXXA Unspecified fall, initial encounter: Secondary | ICD-10-CM | POA: Diagnosis not present

## 2024-01-17 ENCOUNTER — Ambulatory Visit: Attending: Internal Medicine

## 2024-01-17 DIAGNOSIS — R296 Repeated falls: Secondary | ICD-10-CM | POA: Insufficient documentation

## 2024-01-17 DIAGNOSIS — R262 Difficulty in walking, not elsewhere classified: Secondary | ICD-10-CM | POA: Insufficient documentation

## 2024-01-17 DIAGNOSIS — R2681 Unsteadiness on feet: Secondary | ICD-10-CM | POA: Insufficient documentation

## 2024-01-17 DIAGNOSIS — M6281 Muscle weakness (generalized): Secondary | ICD-10-CM | POA: Insufficient documentation

## 2024-01-18 ENCOUNTER — Telehealth: Payer: Self-pay

## 2024-01-18 NOTE — Telephone Encounter (Signed)
 Pt did not arrive for scheduled appointment 01/17/24. No telephone call nor message preceeded this absence. Author attempted to contact pt via telephone number listed in chart. Patient reports this was an old appointment made months ago, unclear why it was not canceled with other previous cancelled appointments while he was on medical hiatus s/p fall/SDH. Pt unaware that he was released by neurosurgical until recently. We confirm his next appointment and plan to resume services on 01/24/24.    12:05 PM, 01/18/24 Peggye JAYSON Linear, PT, DPT Physical Therapist - [facility]  Outpatient Physical Therapy- Main Campus [phone]

## 2024-01-19 ENCOUNTER — Ambulatory Visit

## 2024-01-24 ENCOUNTER — Ambulatory Visit

## 2024-01-24 DIAGNOSIS — M6281 Muscle weakness (generalized): Secondary | ICD-10-CM | POA: Diagnosis present

## 2024-01-24 DIAGNOSIS — R296 Repeated falls: Secondary | ICD-10-CM

## 2024-01-24 DIAGNOSIS — R2681 Unsteadiness on feet: Secondary | ICD-10-CM

## 2024-01-24 DIAGNOSIS — R262 Difficulty in walking, not elsewhere classified: Secondary | ICD-10-CM

## 2024-01-24 NOTE — Therapy (Signed)
 OUTPATIENT PHYSICAL THERAPY EVALUATION  Patient Name: Alec White MRN: [id number] DOB:October 19, 1940, 83 y.o., male Today's Date: 01/24/2024  PCP: Cleotilde Oneil FALCON, MD REFERRING PROVIDER: Cleotilde Oneil FALCON, MD  END OF SESSION:  PT End of Session - 01/24/24 1318     Visit Number 1    Number of Visits 24    Date for Recertification  04/17/24    Authorization Type [employer]: 01/24/24-04/17/24    Progress Note Due on Visit 10    PT Start Time 1315    PT Stop Time 1355    PT Time Calculation (min) 40 min    Equipment Utilized During Treatment Gait belt    Activity Tolerance Patient tolerated treatment well;No increased pain    Behavior During Therapy [facility] for tasks assessed/performed          No past medical history on file. 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/min (HCC) 11/25/2022   Hypothyroidism 11/25/2022   Essential hypertension 11/25/2022   Benign prostatic hyperplasia 06/10/2021   Type 2 diabetes mellitus with diabetic chronic kidney disease (HCC) 06/10/2021   Chronic gouty arthritis 04/20/2016   Hyperlipidemia, mixed 10/23/2015   CAD S/P percutaneous coronary angioplasty 02/04/2015   Diet-controlled diabetes mellitus (HCC) 02/04/2015   ONSET DATE: October 2024 is most recent REFERRING DIAG: R42 (ICD-10-CM) - Disequilibrium THERAPY DIAG:  Muscle weakness (generalized)  Difficulty in walking, not elsewhere classified  Unsteadiness on feet  Repeated falls  Rationale for  Evaluation and Treatment: Rehabilitation  SUBJECTIVE:                                                                                                                                                                                             SUBJECTIVE STATEMENT: Pt returns to PT after medical hiatus; Pt sustained a fall in the house on 12/08/23, sustained SDH, followed by neurosurgical, bleed found to be stable, then pt cleared for coming back to OPPT by neurosurgical on 01/16/24.   PERTINENT HISTORY:  83yoM referred to OPPT by PCP after recurrent ~4-5 falls, took PT here from 10/06/23-12/08/23. Pt sustained a fall in the house on 12/08/23, sustained  SDH, followed by neurosurgical, bleed found to be stable, then pt cleared for coming back to OPPT by neurosurgical on 01/16/24. Pt denies any patterns with falls. Says it seems like his legs give away but he has also tripped leading to a fall posteriorly onto his tailbone which has been most recent. Pt previously able to get up on his own if he has something he can hold onto with his UE, however more recently he has needed assistance to get up, his wife unable to provide this degree of assistance. PMH includes remote lumbar fusion with chronic pain, failed implementation of pain stimulator. Pt's pain remains ~7/10 with periodic exacerbations, but is generally poorly tolerant of time in upright, and not tolerant of erect postures. Pt found success here with upwalker and purchased his own in October, essentially able to double his distance tolerance and triple his self selected gait speed. Due to suspected tripping incident (as of 01/24/24), pt using SPC as little as possible, but using RW inside and 2nd RW outside of house; upwalker reserved for longer distances in community hence, has largely remained in the car since his fall. Pt return to clinic 01/24/24 reports feelings deconditioned in general, but mostly his balance is 50% of what it was on his last PT session.  Pt also reports a 2nd recent fall weekend 12/6 moving a boxed christmas tree.   PAIN:  Are you having pain? 7/10 low back pain;    PRECAUTIONS: Spine stimulator in place  WEIGHT BEARING RESTRICTIONS: No FALLS: Has patient fallen in last 6 months? Yes. Number of falls 7  LIVING ENVIRONMENT: Lives with: lives with their spouse Lives in: House/apartment Stairs: Yes: Internal: flight steps; on right going up and on left going up Has following equipment at home: Single point cane, Walker - 2 wheeled, shower chair, bed side commode, and Grab bars  PLOF: Independent  PATIENT GOALS: Improve his balance and his back pain some  OBJECTIVE:  Note: Objective measures were completed at evaluation unless otherwise noted.  10/15/23 FGA 12/30  01/24/24 -5xSTS: 8.97sec hands free, mild unsteady - : 10.53sec c RW  -TUG: 14.62sec c RW  -ABC Scale: 64.4%                                                                                               TREATMENT DATE 01/24/24 :    -Hot pack applied to low back while seated in session due to chronic low back pain.   -5xSTS: 8.97sec hands free, mild unsteady - : 10.53sec c RW  -TUG: 14.62sec c RW  -ABC Scale: 64.4%   -//bar loop with 2 hand support on inside and 1 hand support on outside: red mat on both, 3 foam pad, 2 board, 4, 8* rocker board (2 2lb AW), clockwise *sit break  -//bar loop with 2 hand support on inside and 1 hand support on outside: red mat on both, 3 foam pad, 2 board, 4, 8* rocker board (2 2lb AW), counterclockwise *sit break  -alternate forward, backward AMB in // bars 2lb AW, red mat and foam pads; 4x, minGuard assist    PATIENT  EDUCATION: Education details: modified workouts since Person educated: Patient and Spouse Education method: Explanation Education comprehension: verbalized understanding  HOME EXERCISE PROGRAM:   GOALS: Goals reviewed with patient? No  SHORT TERM GOALS: Target date:  03/09/24  Pt will be independent with HEP to Improve balance and LE strength to reduce falls risk.  Baseline: 01/24/24: provided stepping activity at counter.  Goal status: INITIAL  LONG TERM GOALS: Target date: 04/17/24  Pt will demonstrate 5xSTS from elevated surface and feet on foam to < 13 seconds, hands free and without LOB to indicate improved postural control during high power transfers activities.  Baseline: 10/06/23: 15.45 seconds; 11/29/23: 8.97sec hands free;  01/24/24: 8.97sec hands free, mild unsteady firm surface Goal status: NEW  2.  Pt will demonstrate performance of 1048ft AMB c upwalker at >0.62m/s without >2/10 pain increase in back to promote improved capacity for limited community distance AMB.  Baseline:  Goal status: NEW  3.  Pt will improve TUG to 11 seconds or less to demonstrate reduced falls risk.  Baseline: 10/06/23: 22 seconds no AD; 01/24/24: 14.62sec (c RW);  Goal status: NEW  4.  Pt will improve ABC Scale >25% to reveal improved confidence n performing basic mobility needed for household AMB.  Baseline: 01/24/24:64.4% Goal status: ONGOING   ASSESSMENT:  CLINICAL IMPRESSION: Pt returns to clinic after several weeks of hiatus, 2 falls and a healing SDH. Pt is fairly close to funcitonal level where he ended in October, similar performance on TUG, 5xSTS, , however these continue to indicated elevated falls risk and all appear less steady compared to pre injury status. Pt issued updated HEP for home balance performance with counter assistance and commenced agility course in clinic today with variable surface types, heights, and levels of stability with ankle resistance, alternating between 1 and 2 hand support. Fatigue remains a considerable factor for pt safety, despite excellent strength, noted quick deterioration of motor control over 3-4 minutes of precarious stepping. Patient will benefit from skilled physical therapy intervention to reduce deficits and  impairments identified in evaluation, in order to reduce pain, improve quality of life, and maximize activity tolerance for ADL, IADL, and leisure/fitness. Physical therapy will help pt achieve long and short term goals of care.    OBJECTIVE IMPAIRMENTS: Abnormal gait, decreased balance, decreased knowledge of use of DME, decreased mobility, difficulty walking, decreased strength, decreased safety awareness, postural dysfunction, and pain.   ACTIVITY LIMITATIONS: carrying, standing, stairs, transfers, and locomotion level  PARTICIPATION LIMITATIONS: shopping and community activity  PERSONAL FACTORS: Age, Behavior pattern, Past/current experiences, Time since onset of injury/illness/exacerbation, and 3+ comorbidities: Hx of multiple spine surgeries, DM, HTN, prior hip fx, CKD are also affecting patient's functional outcome.   REHAB POTENTIAL: Fair Chronicity of symptoms  CLINICAL DECISION MAKING: Evolving/moderate complexity  EVALUATION COMPLEXITY: Moderate  PLAN: PT FREQUENCY: 1-2x/week PT DURATION: 12 weeks PLANNED INTERVENTIONS: 97164- PT Re-evaluation, 97110-Therapeutic exercises, 97530- Therapeutic activity, 97112- Neuromuscular re-education, [phone]- Self Care, 02883- Gait training, Patient/Family education, Balance training, Stair training, and DME instructions  PLAN FOR NEXT SESSION: Expand HEP, dynamic balance training, activity tolerance training.   1:23 PM, 01/24/24 Peggye JAYSON Linear, PT, DPT Physical Therapist - [facility]  Outpatient Physical Therapy- Main Campus [phone]

## 2024-01-26 ENCOUNTER — Ambulatory Visit

## 2024-01-26 DIAGNOSIS — R296 Repeated falls: Secondary | ICD-10-CM

## 2024-01-26 DIAGNOSIS — M6281 Muscle weakness (generalized): Secondary | ICD-10-CM | POA: Diagnosis not present

## 2024-01-26 DIAGNOSIS — R262 Difficulty in walking, not elsewhere classified: Secondary | ICD-10-CM

## 2024-01-26 DIAGNOSIS — R2681 Unsteadiness on feet: Secondary | ICD-10-CM

## 2024-01-26 NOTE — Therapy (Signed)
 OUTPATIENT PHYSICAL THERAPY TREATMENT  Patient Name: Alec White MRN: [id number] DOB:09-May-1940, 83 y.o., male Today's Date: 01/26/2024  PCP: Cleotilde Oneil FALCON, MD REFERRING PROVIDER: Cleotilde Oneil FALCON, MD  END OF SESSION:  PT End of Session - 01/26/24 1329     Visit Number 2    Number of Visits 24    Date for Recertification  04/17/24    Authorization Type [employer]: 01/24/24-04/17/24    Progress Note Due on Visit 10    PT Start Time 1320    PT Stop Time 1355    PT Time Calculation (min) 35 min    Activity Tolerance Patient tolerated treatment well;No increased pain    Behavior During Therapy [facility] for tasks assessed/performed          No past medical history on file. 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/min (HCC) 11/25/2022   Hypothyroidism 11/25/2022   Essential hypertension 11/25/2022   Benign prostatic hyperplasia 06/10/2021   Type 2 diabetes mellitus with diabetic chronic kidney disease (HCC) 06/10/2021   Chronic gouty arthritis 04/20/2016   Hyperlipidemia, mixed 10/23/2015   CAD S/P percutaneous coronary angioplasty 02/04/2015   Diet-controlled diabetes mellitus (HCC) 02/04/2015   ONSET DATE: October 2024 is most recent REFERRING DIAG: R42 (ICD-10-CM) - Disequilibrium THERAPY DIAG:  Muscle weakness (generalized)  Difficulty in walking, not elsewhere classified  Unsteadiness on feet  Repeated falls  Rationale for Evaluation and Treatment:  Rehabilitation  SUBJECTIVE:                                                                                                                                                                                             SUBJECTIVE STATEMENT: Pt reports feeling fine after last session. Doing well today, pain about average.   PERTINENT HISTORY:  83yoM referred to OPPT by PCP after recurrent ~4-5 falls, took PT here from 10/06/23-12/08/23. Pt sustained a fall in the house on 12/08/23, sustained SDH, followed by neurosurgical, bleed found to be stable, then pt cleared for coming back to OPPT by neurosurgical on 01/16/24. Pt denies any patterns with falls. Says it seems like his legs give  away but he has also tripped leading to a fall posteriorly onto his tailbone which has been most recent. Pt previously able to get up on his own if he has something he can hold onto with his UE, however more recently he has needed assistance to get up, his wife unable to provide this degree of assistance. PMH includes remote lumbar fusion with chronic pain, failed implementation of pain stimulator. Pt's pain remains ~7/10 with periodic exacerbations, but is generally poorly tolerant of time in upright, and not tolerant of erect postures. Pt found success here with upwalker and purchased his own in October, essentially able to double his distance tolerance and triple his self selected gait speed. Due to suspected tripping incident (as of 01/24/24), pt using SPC as little as possible, but using RW inside and 2nd RW outside of house; upwalker reserved for longer distances in community hence, has largely remained in the car since his fall. Pt return to clinic 01/24/24 reports feelings deconditioned in general, but mostly his balance is 50% of what it was on his last PT session. Pt also reports a 2nd recent fall weekend 12/6 moving a boxed christmas tree.   PAIN:  Are you having pain? 7/10 low back pain;    PRECAUTIONS: Spine stimulator  in place  WEIGHT BEARING RESTRICTIONS: No FALLS: Has patient fallen in last 6 months? Yes. Number of falls 7  LIVING ENVIRONMENT: Lives with: lives with their spouse Lives in: House/apartment Stairs: Yes: Internal: flight steps; on right going up and on left going up Has following equipment at home: Single point cane, Walker - 2 wheeled, shower chair, bed side commode, and Grab bars  PLOF: Independent  PATIENT GOALS: Improve his balance and his back pain some  OBJECTIVE:  Note: Objective measures were completed at evaluation unless otherwise noted.  10/15/23 FGA 12/30  01/24/24 -5xSTS: 8.97sec hands free, mild unsteady - : 10.53sec c RW  -TUG: 14.62sec c RW  -ABC Scale: 64.4%                                                                                               TREATMENT DATE 01/26/2024 :    -Hot pack applied to low back while seated in session due to chronic low back pain.   -overground AMB 300ft c upwalker *sit break  -overground AMB 631ft with upwalker   -STS from chair x12, hands free, slow for balanced transitions   -standing firm surface hands free to sort deck of cards by suit (2 minute s7 sec)  -standing foam surface, hands free to sort cards by pairs of sum of 11 (4 minute 17 second, 2 LOB)   -//bar loop with 2 hand support on inside and 1 hand support on outside: red mat on both, 3 foam pad, 2 board, 4, 8* rocker board, clockwise  -//bar loop with 2 hand support on inside and 1 hand support on outside: red mat on both, 3 foam pad, 2 board, 4, 8* rocker board, counterclockwise    PATIENT EDUCATION: Education details: modified workouts since Person educated: Patient and Spouse Education method: Explanation Education comprehension: verbalized  understanding  HOME EXERCISE PROGRAM:   GOALS: Goals reviewed with patient? No  SHORT TERM GOALS: Target date: 03/09/24  Pt will be independent with HEP to Improve balance and LE strength to reduce  falls risk.  Baseline: 01/24/24: provided stepping activity at counter.  Goal status: INITIAL  LONG TERM GOALS: Target date: 04/17/24  Pt will demonstrate 5xSTS from elevated surface and feet on foam to < 13 seconds, hands free and without LOB to indicate improved postural control during high power transfers activities.  Baseline: 10/06/23: 15.45 seconds; 11/29/23: 8.97sec hands free;  01/24/24: 8.97sec hands free, mild unsteady firm surface Goal status: NEW  2.  Pt will demonstrate performance of 1074ft AMB c upwalker at >0.50m/s without >2/10 pain increase in back to promote improved capacity for limited community distance AMB.  Baseline:  Goal status: NEW  3.  Pt will improve TUG to 11 seconds or less to demonstrate reduced falls risk.  Baseline: 10/06/23: 22 seconds no AD; 01/24/24: 14.62sec (c RW);  Goal status: NEW  4.  Pt will improve ABC Scale >25% to reveal improved confidence n performing basic mobility needed for household AMB.  Baseline: 01/24/24:64.4% Goal status: ONGOING   ASSESSMENT:  CLINICAL IMPRESSION: Continued to work on overground AMB static and dynamic balance, and stepping accuracy. Pt takes breaks between. Pain stable in session. Patient will benefit from skilled physical therapy intervention to reduce deficits and impairments identified in evaluation, in order to reduce pain, improve quality of life, and maximize activity tolerance for ADL, IADL, and leisure/fitness. Physical therapy will help pt achieve long and short term goals of care.    OBJECTIVE IMPAIRMENTS: Abnormal gait, decreased balance, decreased knowledge of use of DME, decreased mobility, difficulty walking, decreased strength, decreased safety awareness, postural dysfunction, and pain.   ACTIVITY LIMITATIONS: carrying, standing, stairs, transfers, and locomotion level  PARTICIPATION LIMITATIONS: shopping and community activity  PERSONAL FACTORS: Age, Behavior pattern, Past/current experiences, Time  since onset of injury/illness/exacerbation, and 3+ comorbidities: Hx of multiple spine surgeries, DM, HTN, prior hip fx, CKD are also affecting patient's functional outcome.   REHAB POTENTIAL: Fair Chronicity of symptoms  CLINICAL DECISION MAKING: Evolving/moderate complexity  EVALUATION COMPLEXITY: Moderate  PLAN: PT FREQUENCY: 1-2x/week PT DURATION: 12 weeks PLANNED INTERVENTIONS: 97164- PT Re-evaluation, 97110-Therapeutic exercises, 97530- Therapeutic activity, 97112- Neuromuscular re-education, [phone]- Self Care, 02883- Gait training, Patient/Family education, Balance training, Stair training, and DME instructions  PLAN FOR NEXT SESSION: Expand HEP, dynamic balance training, activity tolerance training.   1:31 PM, 01/26/2024 Peggye JAYSON Linear, PT, DPT Physical Therapist - [place] [facility]  Outpatient Physical Therapy- [facility] [phone]

## 2024-01-30 ENCOUNTER — Ambulatory Visit

## 2024-01-30 DIAGNOSIS — R296 Repeated falls: Secondary | ICD-10-CM

## 2024-01-30 DIAGNOSIS — R262 Difficulty in walking, not elsewhere classified: Secondary | ICD-10-CM

## 2024-01-30 DIAGNOSIS — M6281 Muscle weakness (generalized): Secondary | ICD-10-CM

## 2024-01-30 DIAGNOSIS — R2681 Unsteadiness on feet: Secondary | ICD-10-CM

## 2024-01-30 NOTE — Therapy (Signed)
 OUTPATIENT PHYSICAL THERAPY TREATMENT  Patient Name: Alec White MRN: [id number] DOB:December 24, 1940, 83 y.o., male Today's Date: 01/30/2024  PCP: Cleotilde Oneil FALCON, MD REFERRING PROVIDER: Cleotilde Oneil FALCON, MD  END OF SESSION:  PT End of Session - 01/30/24 1455     Visit Number 3    Number of Visits 24    Date for Recertification  04/17/24    Authorization Type [employer]: 01/24/24-04/17/24    Progress Note Due on Visit 10    PT Start Time 1450    PT Stop Time 1530    PT Time Calculation (min) 40 min    Equipment Utilized During Treatment Gait belt    Activity Tolerance Patient tolerated treatment well;No increased pain    Behavior During Therapy [facility] for tasks assessed/performed          No past medical history on file. 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/min (HCC) 11/25/2022   Hypothyroidism 11/25/2022   Essential hypertension 11/25/2022   Benign prostatic hyperplasia 06/10/2021   Type 2 diabetes mellitus with diabetic chronic kidney disease (HCC) 06/10/2021   Chronic gouty arthritis 04/20/2016   Hyperlipidemia, mixed 10/23/2015   CAD S/P percutaneous coronary angioplasty 02/04/2015   Diet-controlled diabetes mellitus (HCC) 02/04/2015   ONSET DATE: October 2024 is most recent REFERRING DIAG: R42 (ICD-10-CM) - Disequilibrium THERAPY DIAG:  Muscle weakness (generalized)  Difficulty in walking, not elsewhere classified  Unsteadiness on feet  Repeated falls  Rationale for  Evaluation and Treatment: Rehabilitation  SUBJECTIVE:                                                                                                                                                                                             SUBJECTIVE STATEMENT: Pt reports feeling fine after last session. Pt was sick over the weekend, stomach issues, feels better today.   PERTINENT HISTORY:  83yoM referred to OPPT by PCP after recurrent ~4-5 falls, took PT here from 10/06/23-12/08/23. Pt sustained a fall in the house on 12/08/23, sustained SDH, followed by neurosurgical, bleed found to be stable, then pt cleared for coming back to OPPT by neurosurgical on  01/16/24. Pt denies any patterns with falls. Says it seems like his legs give away but he has also tripped leading to a fall posteriorly onto his tailbone which has been most recent. Pt previously able to get up on his own if he has something he can hold onto with his UE, however more recently he has needed assistance to get up, his wife unable to provide this degree of assistance. PMH includes remote lumbar fusion with chronic pain, failed implementation of pain stimulator. Pt's pain remains ~7/10 with periodic exacerbations, but is generally poorly tolerant of time in upright, and not tolerant of erect postures. Pt found success here with upwalker and purchased his own in October, essentially able to double his distance tolerance and triple his self selected gait speed. Due to suspected tripping incident (as of 01/24/24), pt using SPC as little as possible, but using RW inside and 2nd RW outside of house; upwalker reserved for longer distances in community hence, has largely remained in the car since his fall. Pt return to clinic 01/24/24 reports feelings deconditioned in general, but mostly his balance is 50% of what it was on his last PT session. Pt also reports a 2nd recent fall weekend 12/6 moving a boxed christmas tree.   PAIN:  Are you having pain?  7/10 low back pain;    PRECAUTIONS: Spine stimulator in place  WEIGHT BEARING RESTRICTIONS: No FALLS: Has patient fallen in last 6 months? Yes. Number of falls 7  LIVING ENVIRONMENT: Lives with: lives with their spouse Lives in: House/apartment Stairs: Yes: Internal: flight steps; on right going up and on left going up Has following equipment at home: Single point cane, Walker - 2 wheeled, shower chair, bed side commode, and Grab bars  PLOF: Independent  PATIENT GOALS: Improve his balance and his back pain some  OBJECTIVE:  Note: Objective measures were completed at evaluation unless otherwise noted.  10/15/23 FGA 12/30  01/24/24 -5xSTS: 8.97sec hands free, mild unsteady - : 10.53sec c RW  -TUG: 14.62sec c RW  -ABC Scale: 64.4%                                                                                               TREATMENT DATE 01/30/2024:   -Hot pack applied to low back while seated in session due to chronic low back pain. -AMB // bars to training stairs 5x forward up, backward down, 2 hand support at minGuardA, 2.5lb AW bilat  -seated recovery -lateral side stepping stairs 2x5 up/down bilat =, 2.5lb AW, minGuard A  -card sorting, making books that add up to 11, then carrying to a table directiyl behind with 1 blue mat trip hazzard in path *minguard assist, takes 1 break at 57m27s for back fatigue -card sorting, making books that add up to 11, then carrying to a table directiyl behind with 1 blue mat trip hazzard in path *minguard assist, takes 1 break at 38m53s for back fatigue -standing balloon juggle with tennis racquet x2 minutes   PATIENT EDUCATION: Education details: modified workouts since Person educated: Patient and Spouse Education method: Explanation Education comprehension: verbalized understanding  HOME  EXERCISE PROGRAM:   GOALS: Goals reviewed with patient? No  SHORT TERM GOALS: Target date: 03/09/24  Pt will be independent with HEP to  Improve balance and LE strength to reduce falls risk.  Baseline: 01/24/24: provided stepping activity at counter.  Goal status: INITIAL  LONG TERM GOALS: Target date: 04/17/24  Pt will demonstrate 5xSTS from elevated surface and feet on foam to < 13 seconds, hands free and without LOB to indicate improved postural control during high power transfers activities.  Baseline: 10/06/23: 15.45 seconds; 11/29/23: 8.97sec hands free;  01/24/24: 8.97sec hands free, mild unsteady firm surface Goal status: NEW  2.  Pt will demonstrate performance of 104ft AMB c upwalker at >0.28m/s without >2/10 pain increase in back to promote improved capacity for limited community distance AMB.  Baseline:  Goal status: NEW  3.  Pt will improve TUG to 11 seconds or less to demonstrate reduced falls risk.  Baseline: 10/06/23: 22 seconds no AD; 01/24/24: 14.62sec (c RW);  Goal status: NEW  4.  Pt will improve ABC Scale >25% to reveal improved confidence n performing basic mobility needed for household AMB.  Baseline: 01/24/24:64.4% Goal status: ONGOING   ASSESSMENT:  CLINICAL IMPRESSION: Continued to work on overground AMB static and dynamic balance, and stepping accuracy. Pt takes breaks between. PPt mor elimited by back fatigue/soreness today than prior sessions. Pt also has more LOB with turning to the right tofay, Minguard assist provided. Patient will benefit from skilled physical therapy intervention to reduce deficits and impairments identified in evaluation, in order to reduce pain, improve quality of life, and maximize activity tolerance for ADL, IADL, and leisure/fitness. Physical therapy will help pt achieve long and short term goals of care.    OBJECTIVE IMPAIRMENTS: Abnormal gait, decreased balance, decreased knowledge of use of DME, decreased mobility, difficulty walking, decreased strength, decreased safety awareness, postural dysfunction, and pain.   ACTIVITY LIMITATIONS: carrying, standing, stairs,  transfers, and locomotion level  PARTICIPATION LIMITATIONS: shopping and community activity  PERSONAL FACTORS: Age, Behavior pattern, Past/current experiences, Time since onset of injury/illness/exacerbation, and 3+ comorbidities: Hx of multiple spine surgeries, DM, HTN, prior hip fx, CKD are also affecting patient's functional outcome.   REHAB POTENTIAL: Fair Chronicity of symptoms  CLINICAL DECISION MAKING: Evolving/moderate complexity  EVALUATION COMPLEXITY: Moderate  PLAN: PT FREQUENCY: 1-2x/week PT DURATION: 12 weeks PLANNED INTERVENTIONS: 97164- PT Re-evaluation, 97110-Therapeutic exercises, 97530- Therapeutic activity, 97112- Neuromuscular re-education, [phone]- Self Care, 02883- Gait training, Patient/Family education, Balance training, Stair training, and DME instructions  PLAN FOR NEXT SESSION: Expand HEP, dynamic balance training, activity tolerance training.   2:57 PM, 01/30/2024 Peggye JAYSON Linear, PT, DPT Physical Therapist - [place] [facility]  Outpatient Physical Therapy- Main [facility] [phone]

## 2024-02-01 ENCOUNTER — Ambulatory Visit

## 2024-02-01 DIAGNOSIS — R296 Repeated falls: Secondary | ICD-10-CM

## 2024-02-01 DIAGNOSIS — R2681 Unsteadiness on feet: Secondary | ICD-10-CM

## 2024-02-01 DIAGNOSIS — R262 Difficulty in walking, not elsewhere classified: Secondary | ICD-10-CM

## 2024-02-01 DIAGNOSIS — M6281 Muscle weakness (generalized): Secondary | ICD-10-CM

## 2024-02-01 NOTE — Therapy (Signed)
 OUTPATIENT PHYSICAL THERAPY TREATMENT  Patient Name: Alec White MRN: [id number] DOB:08/05/1940, 83 y.o., male Today's Date: 02/01/2024  PCP: Cleotilde Oneil FALCON, MD REFERRING PROVIDER: Cleotilde Oneil FALCON, MD  END OF SESSION:  PT End of Session - 02/01/24 1407     Visit Number 4    Number of Visits 24    Date for Recertification  04/17/24    Authorization Type [employer]: 01/24/24-04/17/24    Progress Note Due on Visit 10    PT Start Time 1359    PT Stop Time 1440    PT Time Calculation (min) 41 min    Equipment Utilized During Treatment Gait belt    Activity Tolerance Patient tolerated treatment well;No increased pain    Behavior During Therapy [facility] for tasks assessed/performed          No past medical history on file. 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/min (HCC) 11/25/2022   Hypothyroidism 11/25/2022   Essential hypertension 11/25/2022   Benign prostatic hyperplasia 06/10/2021   Type 2 diabetes mellitus with diabetic chronic kidney disease (HCC) 06/10/2021   Chronic gouty arthritis 04/20/2016   Hyperlipidemia, mixed 10/23/2015   CAD S/P percutaneous coronary angioplasty 02/04/2015   Diet-controlled diabetes mellitus (HCC) 02/04/2015   ONSET DATE: October 2024 is most recent REFERRING DIAG: R42 (ICD-10-CM) - Disequilibrium THERAPY DIAG:  Muscle weakness (generalized)  Difficulty in walking, not elsewhere classified  Unsteadiness on feet  Repeated falls  Rationale for  Evaluation and Treatment: Rehabilitation  SUBJECTIVE:                                                                                                                                                                                             SUBJECTIVE STATEMENT: Pt pretty sore after last session. Pain better today.   PERTINENT HISTORY:  83yoM referred to OPPT by PCP after recurrent ~4-5 falls, took PT here from 10/06/23-12/08/23. Pt sustained a fall in the house on 12/08/23, sustained SDH, followed by neurosurgical, bleed found to be stable, then pt cleared for coming back to OPPT by neurosurgical on 01/16/24. Pt denies any patterns with falls. Says it  seems like his legs give away but he has also tripped leading to a fall posteriorly onto his tailbone which has been most recent. Pt previously able to get up on his own if he has something he can hold onto with his UE, however more recently he has needed assistance to get up, his wife unable to provide this degree of assistance. PMH includes remote lumbar fusion with chronic pain, failed implementation of pain stimulator. Pt's pain remains ~7/10 with periodic exacerbations, but is generally poorly tolerant of time in upright, and not tolerant of erect postures. Pt found success here with upwalker and purchased his own in October, essentially able to double his distance tolerance and triple his self selected gait speed. Due to suspected tripping incident (as of 01/24/24), pt using SPC as little as possible, but using RW inside and 2nd RW outside of house; upwalker reserved for longer distances in community hence, has largely remained in the car since his fall. Pt return to clinic 01/24/24 reports feelings deconditioned in general, but mostly his balance is 50% of what it was on his last PT session. Pt also reports a 2nd recent fall weekend 12/6 moving a boxed christmas tree.   PAIN:  Are you having pain? 7/10 low back pain;    PRECAUTIONS: Spine stimulator  in place  WEIGHT BEARING RESTRICTIONS: No FALLS: Has patient fallen in last 6 months? Yes. Number of falls 7  LIVING ENVIRONMENT: Lives with: lives with their spouse Lives in: House/apartment Stairs: Yes: Internal: flight steps; on right going up and on left going up Has following equipment at home: Single point cane, Walker - 2 wheeled, shower chair, bed side commode, and Grab bars  PLOF: Independent  PATIENT GOALS: Improve his balance and his back pain some  OBJECTIVE:  Note: Objective measures were completed at evaluation unless otherwise noted.  10/15/23 FGA 12/30  01/24/24 -5xSTS: 8.97sec hands free, mild unsteady - : 10.53sec c RW  -TUG: 14.62sec c RW  -ABC Scale: 64.4%                                                                                               TREATMENT DATE 02/01/2024:   -AMB overground 46ft c upwalker: 5m23s -seated break  -AMB overground 441ft c upwalker: 63m37s -seated break  -AMB overground 312ft c upwalker + 5lb AW bilat  9m35s -seated break  -AMB overground 362ft c upwalker + 5lb AW bilat  63m07s   -elevated sit-to-stand, feet on airex pad 4x, 4x eyes closed  -seated break  -elevated sit-to-stand, feet on airex pad 8 eyes closed -> open + ball chest press    01/30/24 -Hot pack applied to low back while seated in session due to chronic low back pain. -AMB // bars to training stairs 5x forward up, backward down, 2 hand support at minGuardA, 2.5lb AW bilat  -seated recovery -lateral side stepping stairs 2x5 up/down bilat =, 2.5lb AW, minGuard A  -card sorting, making books that add up to 11, then carrying to a table directiyl behind with 1 blue mat trip hazzard in path *minguard assist, takes 1 break at  3m27s for back fatigue -card sorting, making books that add up to 11, then carrying to a table directiyl behind with 1 blue mat trip hazzard in path *minguard assist, takes 1 break at 59m53s for back fatigue -standing balloon juggle with  tennis racquet x2 minutes   PATIENT EDUCATION: Education details: modified workouts since Person educated: Patient and Spouse Education method: Explanation Education comprehension: verbalized understanding  HOME EXERCISE PROGRAM:   GOALS: Goals reviewed with patient? No  SHORT TERM GOALS: Target date: 03/09/24  Pt will be independent with HEP to Improve balance and LE strength to reduce falls risk.  Baseline: 01/24/24: provided stepping activity at counter.  Goal status: INITIAL  LONG TERM GOALS: Target date: 04/17/24  Pt will demonstrate 5xSTS from elevated surface and feet on foam to < 13 seconds, hands free and without LOB to indicate improved postural control during high power transfers activities.  Baseline: 10/06/23: 15.45 seconds; 11/29/23: 8.97sec hands free;  01/24/24: 8.97sec hands free, mild unsteady firm surface Goal status: NEW  2.  Pt will demonstrate performance of 1070ft AMB c upwalker at >0.28m/s without >2/10 pain increase in back to promote improved capacity for limited community distance AMB.  Baseline:  Goal status: NEW  3.  Pt will improve TUG to 11 seconds or less to demonstrate reduced falls risk.  Baseline: 10/06/23: 22 seconds no AD; 01/24/24: 14.62sec (c RW);  Goal status: NEW  4.  Pt will improve ABC Scale >25% to reveal improved confidence n performing basic mobility needed for household AMB.  Baseline: 01/24/24:64.4% Goal status: ONGOING   ASSESSMENT:  CLINICAL IMPRESSION: More focus on regaining overground AMB fitness today. Also spent time on motor control sduring transfers, incorporated cognitive parts of all activities. Pt takes breaks as needed. Patient will benefit from skilled physical therapy intervention to reduce deficits and impairments identified in evaluation, in order to reduce pain, improve quality of life, and maximize activity tolerance for ADL, IADL, and leisure/fitness. Physical therapy will help pt achieve long and short term goals of  care.    OBJECTIVE IMPAIRMENTS: Abnormal gait, decreased balance, decreased knowledge of use of DME, decreased mobility, difficulty walking, decreased strength, decreased safety awareness, postural dysfunction, and pain.   ACTIVITY LIMITATIONS: carrying, standing, stairs, transfers, and locomotion level  PARTICIPATION LIMITATIONS: shopping and community activity  PERSONAL FACTORS: Age, Behavior pattern, Past/current experiences, Time since onset of injury/illness/exacerbation, and 3+ comorbidities: Hx of multiple spine surgeries, DM, HTN, prior hip fx, CKD are also affecting patient's functional outcome.   REHAB POTENTIAL: Fair Chronicity of symptoms  CLINICAL DECISION MAKING: Evolving/moderate complexity  EVALUATION COMPLEXITY: Moderate  PLAN: PT FREQUENCY: 1-2x/week PT DURATION: 12 weeks PLANNED INTERVENTIONS: 97164- PT Re-evaluation, 97110-Therapeutic exercises, 97530- Therapeutic activity, 97112- Neuromuscular re-education, [phone]- Self Care, 02883- Gait training, Patient/Family education, Balance training, Stair training, and DME instructions  PLAN FOR NEXT SESSION: Expand HEP, dynamic balance training, activity tolerance training.   2:08 PM, 02/01/2024 Peggye JAYSON Linear, PT, DPT Physical Therapist - [facility]  Outpatient Physical Therapy- Main Campus [phone]

## 2024-02-06 ENCOUNTER — Ambulatory Visit

## 2024-02-06 DIAGNOSIS — R262 Difficulty in walking, not elsewhere classified: Secondary | ICD-10-CM

## 2024-02-06 DIAGNOSIS — R296 Repeated falls: Secondary | ICD-10-CM

## 2024-02-06 DIAGNOSIS — M6281 Muscle weakness (generalized): Secondary | ICD-10-CM

## 2024-02-06 DIAGNOSIS — R2681 Unsteadiness on feet: Secondary | ICD-10-CM

## 2024-02-06 NOTE — Therapy (Signed)
 " OUTPATIENT PHYSICAL THERAPY TREATMENT  Patient Name: Alec White MRN: [id number] DOB:November 12, 1940, 83 y.o., male Today's Date: 02/06/2024  PCP: Cleotilde Oneil FALCON, MD REFERRING PROVIDER: Cleotilde Oneil FALCON, MD  END OF SESSION:  PT End of Session - 02/06/24 1155     Visit Number 5    Number of Visits 24    Date for Recertification  04/17/24    Authorization Type [employer]: 01/24/24-04/17/24    Progress Note Due on Visit 10    PT Start Time 1150    PT Stop Time 1230    PT Time Calculation (min) 40 min    Equipment Utilized During Treatment Gait belt    Activity Tolerance Patient tolerated treatment well;No increased pain    Behavior During Therapy [facility] for tasks assessed/performed          No past medical history on file. 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/min (HCC) 11/25/2022   Hypothyroidism 11/25/2022   Essential hypertension 11/25/2022   Benign prostatic hyperplasia 06/10/2021   Type 2 diabetes mellitus with diabetic chronic kidney disease (HCC) 06/10/2021   Chronic gouty arthritis 04/20/2016   Hyperlipidemia, mixed 10/23/2015   CAD S/P percutaneous coronary angioplasty 02/04/2015   Diet-controlled diabetes mellitus (HCC) 02/04/2015   ONSET DATE: October 2024 is most recent REFERRING DIAG: R42 (ICD-10-CM) - Disequilibrium THERAPY DIAG:  Muscle weakness (generalized)  Difficulty in walking, not elsewhere classified  Unsteadiness on feet  Repeated falls  Rationale for  Evaluation and Treatment: Rehabilitation  SUBJECTIVE:                                                                                                                                                                                             SUBJECTIVE STATEMENT: Back pain remains fairly aggravated. No falls over the weekend. Pt continues to struggle with regaining energy levels and tolerance to home activity.   PERTINENT HISTORY:  83yoM referred to OPPT by PCP after recurrent ~4-5 falls, took PT here from 10/06/23-12/08/23. Pt sustained a fall in the house on 12/08/23, sustained SDH, followed by neurosurgical, bleed found to be stable, then pt cleared for coming  back to OPPT by neurosurgical on 01/16/24. Pt denies any patterns with falls. Says it seems like his legs give away but he has also tripped leading to a fall posteriorly onto his tailbone which has been most recent. Pt previously able to get up on his own if he has something he can hold onto with his UE, however more recently he has needed assistance to get up, his wife unable to provide this degree of assistance. PMH includes remote lumbar fusion with chronic pain, failed implementation of pain stimulator. Pt's pain remains ~7/10 with periodic exacerbations, but is generally poorly tolerant of time in upright, and not tolerant of erect postures. Pt found success here with upwalker and purchased his own in October, essentially able to double his distance tolerance and triple his self selected gait speed. Due to suspected tripping incident (as of 01/24/24), pt using SPC as little as possible, but using RW inside and 2nd RW outside of house; upwalker reserved for longer distances in community hence, has largely remained in the car since his fall. Pt return to clinic 01/24/24 reports feelings deconditioned in general, but mostly his balance is 50% of what it was on his last PT session. Pt also reports a 2nd recent fall weekend 12/6 moving a boxed christmas  tree.   PAIN:  Are you having pain? 7.5/10 low back pain;    PRECAUTIONS: Spine stimulator in place  WEIGHT BEARING RESTRICTIONS: No FALLS: Has patient fallen in last 6 months? Yes. Number of falls 7  LIVING ENVIRONMENT: Lives with: lives with their spouse Lives in: House/apartment Stairs: Yes: Internal: flight steps; on right going up and on left going up Has following equipment at home: Single point cane, Walker - 2 wheeled, shower chair, bed side commode, and Grab bars  PLOF: Independent  PATIENT GOALS: Improve his balance and his back pain some  OBJECTIVE:  Note: Objective measures were completed at evaluation unless otherwise noted.  10/15/23 FGA 12/30  01/24/24 -5xSTS: 8.97sec hands free, mild unsteady - : 10.53sec c RW  -TUG: 14.62sec c RW  -ABC Scale: 64.4%                                                                                              TREATMENT DATE 02/06/2024:   -Hot pack applied to low back while seated in session due to chronic low back pain. -AMB overground c upwalker: 647ft 60m58s  -with 3lb AW lateral stepping 4x62ft bilat in // bars  *seated break -with 3lb AW forward stepping and backward stepping 6x each in // bars  *seated break -with 3lb AW lateral stepping 6x32ft bilat in // bars  *seated break -with 3lb AW forward stepping and backward stepping 6x each in // bars  *seated break  02/01/24 -AMB overground 462ft c upwalker: 66m23s -seated break  -AMB overground 466ft c upwalker: 26m37s -seated break  -AMB overground 312ft c upwalker + 5lb AW bilat  19m35s -seated break  -AMB overground 339ft c upwalker + 5lb AW bilat  16m07s   -elevated sit-to-stand, feet on airex pad 4x, 4x eyes closed  -seated break  -elevated sit-to-stand, feet  on airex pad 8 eyes closed -> open + ball chest press    01/30/24 -Hot pack applied to low back while seated in session due to chronic low back pain. -AMB // bars to training stairs 5x forward up,  backward down, 2 hand support at minGuardA, 2.5lb AW bilat  -seated recovery -lateral side stepping stairs 2x5 up/down bilat =, 2.5lb AW, minGuard A  -card sorting, making books that add up to 11, then carrying to a table directiyl behind with 1 blue mat trip hazzard in path *minguard assist, takes 1 break at 2m27s for back fatigue -card sorting, making books that add up to 11, then carrying to a table directiyl behind with 1 blue mat trip hazzard in path *minguard assist, takes 1 break at 19m53s for back fatigue -standing balloon juggle with tennis racquet x2 minutes   PATIENT EDUCATION: Education details: modified workouts since Person educated: Patient and Spouse Education method: Explanation Education comprehension: verbalized understanding  HOME EXERCISE PROGRAM:   GOALS: Goals reviewed with patient? No  SHORT TERM GOALS: Target date: 03/09/24  Pt will be independent with HEP to Improve balance and LE strength to reduce falls risk.  Baseline: 01/24/24: provided stepping activity at counter.  Goal status: INITIAL  LONG TERM GOALS: Target date: 04/17/24  Pt will demonstrate 5xSTS from elevated surface and feet on foam to < 13 seconds, hands free and without LOB to indicate improved postural control during high power transfers activities.  Baseline: 10/06/23: 15.45 seconds; 11/29/23: 8.97sec hands free;  01/24/24: 8.97sec hands free, mild unsteady firm surface Goal status: NEW  2.  Pt will demonstrate performance of 1034ft AMB c upwalker at >0.21m/s without >2/10 pain increase in back to promote improved capacity for limited community distance AMB.  Baseline: 02/06/24: 641ft is farthest distance since returning Goal status: NEW  3.  Pt will improve TUG to 11 seconds or less to demonstrate reduced falls risk.  Baseline: 10/06/23: 22 seconds no AD; 01/24/24: 14.62sec (c RW);  Goal status: NEW  4.  Pt will improve ABC Scale >25% to reveal improved confidence n performing basic mobility  needed for household AMB.  Baseline: 01/24/24:64.4% Goal status: ONGOING   ASSESSMENT:  CLINICAL IMPRESSION: Pt continues to steady regain stamina and overground activity tolerance following acute illness, however he has not returned to his preadmission status. Pt remains focused on goal, but does require regular breaks and heat to back for best performance.  Patient will benefit from skilled physical therapy intervention to reduce deficits and impairments identified in evaluation, in order to reduce pain, improve quality of life, and maximize activity tolerance for ADL, IADL, and leisure/fitness. Physical therapy will help pt achieve long and short term goals of care.    OBJECTIVE IMPAIRMENTS: Abnormal gait, decreased balance, decreased knowledge of use of DME, decreased mobility, difficulty walking, decreased strength, decreased safety awareness, postural dysfunction, and pain.   ACTIVITY LIMITATIONS: carrying, standing, stairs, transfers, and locomotion level  PARTICIPATION LIMITATIONS: shopping and community activity  PERSONAL FACTORS: Age, Behavior pattern, Past/current experiences, Time since onset of injury/illness/exacerbation, and 3+ comorbidities: Hx of multiple spine surgeries, DM, HTN, prior hip fx, CKD are also affecting patient's functional outcome.   REHAB POTENTIAL: Fair Chronicity of symptoms  CLINICAL DECISION MAKING: Evolving/moderate complexity  EVALUATION COMPLEXITY: Moderate  PLAN: PT FREQUENCY: 1-2x/week PT DURATION: 12 weeks PLANNED INTERVENTIONS: 97164- PT Re-evaluation, 97110-Therapeutic exercises, 97530- Therapeutic activity, [id number]- Neuromuscular re-education, [phone]- Self Care, 02883- Gait training, Patient/Family education, Balance training, Stair training, and DME instructions  PLAN FOR NEXT SESSION: Expand HEP, dynamic balance training, activity tolerance training.   11:56 AM, 02/06/2024 Alec White, PT, DPT Physical Therapist - [facility]  Outpatient Physical Therapy- Main Campus [phone]    "

## 2024-02-08 ENCOUNTER — Ambulatory Visit

## 2024-02-08 DIAGNOSIS — M6281 Muscle weakness (generalized): Secondary | ICD-10-CM

## 2024-02-08 DIAGNOSIS — R2681 Unsteadiness on feet: Secondary | ICD-10-CM

## 2024-02-08 DIAGNOSIS — R296 Repeated falls: Secondary | ICD-10-CM

## 2024-02-08 DIAGNOSIS — R262 Difficulty in walking, not elsewhere classified: Secondary | ICD-10-CM

## 2024-02-08 NOTE — Therapy (Signed)
 " OUTPATIENT PHYSICAL THERAPY TREATMENT  Patient Name: Alec White MRN: [id number] DOB:11/30/1940, 83 y.o., male Today's Date: 02/08/2024  PCP: Cleotilde Oneil FALCON, MD REFERRING PROVIDER: Cleotilde Oneil FALCON, MD  END OF SESSION:  PT End of Session - 02/08/24 0943     Visit Number 6    Number of Visits 24    Date for Recertification  04/17/24    Authorization Type [employer]: 01/24/24-04/17/24    Progress Note Due on Visit 10    PT Start Time 0930    PT Stop Time 1010    PT Time Calculation (min) 40 min    Equipment Utilized During Treatment Gait belt    Activity Tolerance Patient tolerated treatment well;No increased pain    Behavior During Therapy [facility] for tasks assessed/performed          No past medical history on file. 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/min (HCC) 11/25/2022   Hypothyroidism 11/25/2022   Essential hypertension 11/25/2022   Benign prostatic hyperplasia 06/10/2021   Type 2 diabetes mellitus with diabetic chronic kidney disease (HCC) 06/10/2021   Chronic gouty arthritis 04/20/2016   Hyperlipidemia, mixed 10/23/2015   CAD S/P percutaneous coronary angioplasty 02/04/2015   Diet-controlled diabetes mellitus (HCC) 02/04/2015   ONSET DATE: October 2024 is most recent REFERRING DIAG: R42 (ICD-10-CM) - Disequilibrium THERAPY DIAG:  Muscle weakness (generalized)  Difficulty in walking, not elsewhere classified  Unsteadiness on feet  Repeated falls  Rationale for  Evaluation and Treatment: Rehabilitation  SUBJECTIVE:                                                                                                                                                                                             SUBJECTIVE STATEMENT: Feet hurting today, but back is actually fairly good in general reported as 6/10.   PERTINENT HISTORY:  83yoM referred to OPPT by PCP after recurrent ~4-5 falls, took PT here from 10/06/23-12/08/23. Pt sustained a fall in the house on 12/08/23, sustained SDH, followed by neurosurgical, bleed found to be stable, then pt cleared for coming back to OPPT by neurosurgical on 01/16/24. Pt denies  any patterns with falls. Says it seems like his legs give away but he has also tripped leading to a fall posteriorly onto his tailbone which has been most recent. Pt previously able to get up on his own if he has something he can hold onto with his UE, however more recently he has needed assistance to get up, his wife unable to provide this degree of assistance. PMH includes remote lumbar fusion with chronic pain, failed implementation of pain stimulator. Pt's pain remains ~7/10 with periodic exacerbations, but is generally poorly tolerant of time in upright, and not tolerant of erect postures. Pt found success here with upwalker and purchased his own in October, essentially able to double his distance tolerance and triple his self selected gait speed. Due to suspected tripping incident (as of 01/24/24), pt using SPC as little as possible, but using RW inside and 2nd RW outside of house; upwalker reserved for longer distances in community hence, has largely remained in the car since his fall. Pt return to clinic 01/24/24 reports feelings deconditioned in general, but mostly his balance is 50% of what it was on his last PT session. Pt also reports a 2nd recent fall weekend 12/6 moving a boxed christmas tree.   PAIN:  Are you having pain? 6/10;     PRECAUTIONS:  Spine stimulator in place  WEIGHT BEARING RESTRICTIONS: No FALLS: Has patient fallen in last 6 months? Yes. Number of falls 7  LIVING ENVIRONMENT: Lives with: lives with their spouse Lives in: House/apartment Stairs: Yes: Internal: flight steps; on right going up and on left going up Has following equipment at home: Single point cane, Walker - 2 wheeled, shower chair, bed side commode, and Grab bars  PLOF: Independent  PATIENT GOALS: Improve his balance and his back pain some  OBJECTIVE:  Note: Objective measures were completed at evaluation unless otherwise noted.  10/15/23 FGA 12/30  01/24/24 -5xSTS: 8.97sec hands free, mild unsteady - : 10.53sec c RW  -TUG: 14.62sec c RW  -ABC Scale: 64.4%                                                                                              TREATMENT DATE 02/08/2024:   -Overground AMB with 4WW 21m15s (479ft)  -side stepping in // bars over agility ladder: 3x Rt/Left, 3x fwd/backward; (3lb AW bilat)  *rest/heat -side stepping in // bars 3lb AW bilat; 2 1/2 roll step overs, 1 foam step step up/down (3x each)  *rest/heat -side stepping in //bars c 4.5lb cable resistance, 3lb AW bilat; 2x Rt, 2x Lt c 4 step up/over; forward backward 3x *rest/heat -side stepping in // bars (no ankle weights) 7.5lb cabel resitance 3x Rt, 3x left    02/06/24 -Hot pack applied to low back while seated in session due to chronic low back pain. -AMB overground c upwalker: 646ft 41m58s  -with 3lb AW lateral stepping 4x45ft bilat in // bars  *seated break -with 3lb AW forward stepping and backward stepping 6x each in // bars  *seated break -with 3lb AW lateral stepping 6x46ft bilat in // bars  *seated break -with 3lb AW  forward stepping and backward stepping 6x each in // bars  *seated break  02/01/24 -AMB overground 423ft c upwalker: 3m23s -seated break  -AMB overground 415ft c upwalker: 65m37s -seated break  -AMB overground 393ft c upwalker + 5lb  AW bilat  72m35s -seated break  -AMB overground 362ft c upwalker + 5lb AW bilat  18m07s   -elevated sit-to-stand, feet on airex pad 4x, 4x eyes closed  -seated break  -elevated sit-to-stand, feet on airex pad 8 eyes closed -> open + ball chest press    01/30/24 -Hot pack applied to low back while seated in session due to chronic low back pain. -AMB // bars to training stairs 5x forward up, backward down, 2 hand support at minGuardA, 2.5lb AW bilat  -seated recovery -lateral side stepping stairs 2x5 up/down bilat =, 2.5lb AW, minGuard A  -card sorting, making books that add up to 11, then carrying to a table directiyl behind with 1 blue mat trip hazzard in path *minguard assist, takes 1 break at 49m27s for back fatigue -card sorting, making books that add up to 11, then carrying to a table directiyl behind with 1 blue mat trip hazzard in path *minguard assist, takes 1 break at 11m53s for back fatigue -standing balloon juggle with tennis racquet x2 minutes   PATIENT EDUCATION: Education details: modified workouts since Person educated: Patient and Spouse Education method: Explanation Education comprehension: verbalized understanding  HOME EXERCISE PROGRAM:   GOALS: Goals reviewed with patient? No  SHORT TERM GOALS: Target date: 03/09/24  Pt will be independent with HEP to Improve balance and LE strength to reduce falls risk.  Baseline: 01/24/24: provided stepping activity at counter.  Goal status: INITIAL  LONG TERM GOALS: Target date: 04/17/24  Pt will demonstrate 5xSTS from elevated surface and feet on foam to < 13 seconds, hands free and without LOB to indicate improved postural control during high power transfers activities.  Baseline: 10/06/23: 15.45 seconds; 11/29/23: 8.97sec hands free;  01/24/24: 8.97sec hands free, mild unsteady firm surface Goal status: NEW  2.  Pt will demonstrate performance of 1056ft AMB c upwalker at >0.15m/s without >2/10 pain increase in back to  promote improved capacity for limited community distance AMB.  Baseline: 02/06/24: 671ft is farthest distance since returning Goal status: NEW  3.  Pt will improve TUG to 11 seconds or less to demonstrate reduced falls risk.  Baseline: 10/06/23: 22 seconds no AD; 01/24/24: 14.62sec (c RW);  Goal status: NEW  4.  Pt will improve ABC Scale >25% to reveal improved confidence n performing basic mobility needed for household AMB.  Baseline: 01/24/24:64.4% Goal status: ONGOING   ASSESSMENT:  CLINICAL IMPRESSION: Added in mor edetailed foot work to lateral and forward backward stetppign today. Pt show signs of progressive improvement in stamina and activity tolerance today. Patient will benefit from skilled physical therapy intervention to reduce deficits and impairments identified in evaluation, in order to reduce pain, improve quality of life, and maximize activity tolerance for ADL, IADL, and leisure/fitness. Physical therapy will help pt achieve long and short term goals of care.    OBJECTIVE IMPAIRMENTS: Abnormal gait, decreased balance, decreased knowledge of use of DME, decreased mobility, difficulty walking, decreased strength, decreased safety awareness, postural dysfunction, and pain.   ACTIVITY LIMITATIONS: carrying, standing, stairs, transfers, and locomotion level  PARTICIPATION LIMITATIONS: shopping and community activity  PERSONAL FACTORS: Age, Behavior pattern, Past/current experiences, Time since onset of injury/illness/exacerbation, and 3+ comorbidities: Hx of multiple spine surgeries, DM, HTN, prior hip fx,  CKD are also affecting patient's functional outcome.   REHAB POTENTIAL: Fair Chronicity of symptoms  CLINICAL DECISION MAKING: Evolving/moderate complexity  EVALUATION COMPLEXITY: Moderate  PLAN: PT FREQUENCY: 1-2x/week PT DURATION: 12 weeks PLANNED INTERVENTIONS: 97164- PT Re-evaluation, 97110-Therapeutic exercises, 97530- Therapeutic activity, [id number]- Neuromuscular  re-education, 97535- Self Care, [id number]- Gait training, Patient/Family education, Balance training, Stair training, and DME instructions  PLAN FOR NEXT SESSION: Expand HEP, dynamic balance training, activity tolerance training.   9:45 AM, 02/08/2024 Peggye JAYSON Linear, PT, DPT Physical Therapist - [facility]  Outpatient Physical Therapy- [facility] [phone]    "

## 2024-02-09 ENCOUNTER — Emergency Department

## 2024-02-09 ENCOUNTER — Other Ambulatory Visit: Payer: Self-pay

## 2024-02-09 ENCOUNTER — Inpatient Hospital Stay
Admission: EM | Admit: 2024-02-09 | Discharge: 2024-02-14 | DRG: 418 | Disposition: A | Discharge status: Home / Self Care

## 2024-02-09 DIAGNOSIS — K8050 Calculus of bile duct without cholangitis or cholecystitis without obstruction: Principal | ICD-10-CM

## 2024-02-09 DIAGNOSIS — E785 Hyperlipidemia, unspecified: Secondary | ICD-10-CM | POA: Diagnosis present

## 2024-02-09 DIAGNOSIS — N2581 Secondary hyperparathyroidism of renal origin: Secondary | ICD-10-CM | POA: Diagnosis present

## 2024-02-09 DIAGNOSIS — N184 Chronic kidney disease, stage 4 (severe): Secondary | ICD-10-CM | POA: Diagnosis present

## 2024-02-09 DIAGNOSIS — D631 Anemia in chronic kidney disease: Secondary | ICD-10-CM | POA: Diagnosis present

## 2024-02-09 DIAGNOSIS — M109 Gout, unspecified: Secondary | ICD-10-CM | POA: Diagnosis present

## 2024-02-09 DIAGNOSIS — K8000 Calculus of gallbladder with acute cholecystitis without obstruction: Secondary | ICD-10-CM | POA: Diagnosis present

## 2024-02-09 DIAGNOSIS — I459 Conduction disorder, unspecified: Secondary | ICD-10-CM | POA: Diagnosis present

## 2024-02-09 DIAGNOSIS — K8001 Calculus of gallbladder with acute cholecystitis with obstruction: Secondary | ICD-10-CM

## 2024-02-09 DIAGNOSIS — I2510 Atherosclerotic heart disease of native coronary artery without angina pectoris: Secondary | ICD-10-CM | POA: Diagnosis present

## 2024-02-09 DIAGNOSIS — K8020 Calculus of gallbladder without cholecystitis without obstruction: Secondary | ICD-10-CM | POA: Diagnosis not present

## 2024-02-09 DIAGNOSIS — E875 Hyperkalemia: Secondary | ICD-10-CM | POA: Diagnosis not present

## 2024-02-09 DIAGNOSIS — N200 Calculus of kidney: Secondary | ICD-10-CM | POA: Diagnosis present

## 2024-02-09 DIAGNOSIS — N189 Chronic kidney disease, unspecified: Secondary | ICD-10-CM | POA: Diagnosis not present

## 2024-02-09 DIAGNOSIS — K810 Acute cholecystitis: Secondary | ICD-10-CM | POA: Diagnosis not present

## 2024-02-09 DIAGNOSIS — E039 Hypothyroidism, unspecified: Secondary | ICD-10-CM | POA: Diagnosis present

## 2024-02-09 DIAGNOSIS — I160 Hypertensive urgency: Secondary | ICD-10-CM | POA: Diagnosis present

## 2024-02-09 DIAGNOSIS — R1011 Right upper quadrant pain: Secondary | ICD-10-CM

## 2024-02-09 DIAGNOSIS — E119 Type 2 diabetes mellitus without complications: Secondary | ICD-10-CM

## 2024-02-09 DIAGNOSIS — Z79899 Other long term (current) drug therapy: Secondary | ICD-10-CM | POA: Diagnosis not present

## 2024-02-09 DIAGNOSIS — Z79890 Hormone replacement therapy (postmenopausal): Secondary | ICD-10-CM | POA: Diagnosis not present

## 2024-02-09 DIAGNOSIS — R1033 Periumbilical pain: Secondary | ICD-10-CM

## 2024-02-09 DIAGNOSIS — K82A1 Gangrene of gallbladder in cholecystitis: Secondary | ICD-10-CM | POA: Diagnosis present

## 2024-02-09 DIAGNOSIS — N281 Cyst of kidney, acquired: Secondary | ICD-10-CM | POA: Diagnosis present

## 2024-02-09 DIAGNOSIS — Z7982 Long term (current) use of aspirin: Secondary | ICD-10-CM

## 2024-02-09 DIAGNOSIS — Z885 Allergy status to narcotic agent status: Secondary | ICD-10-CM | POA: Diagnosis not present

## 2024-02-09 DIAGNOSIS — N400 Enlarged prostate without lower urinary tract symptoms: Secondary | ICD-10-CM | POA: Diagnosis present

## 2024-02-09 DIAGNOSIS — I10 Essential (primary) hypertension: Secondary | ICD-10-CM

## 2024-02-09 DIAGNOSIS — Z7984 Long term (current) use of oral hypoglycemic drugs: Secondary | ICD-10-CM

## 2024-02-09 DIAGNOSIS — N179 Acute kidney failure, unspecified: Secondary | ICD-10-CM | POA: Diagnosis present

## 2024-02-09 DIAGNOSIS — E1122 Type 2 diabetes mellitus with diabetic chronic kidney disease: Secondary | ICD-10-CM | POA: Diagnosis present

## 2024-02-09 DIAGNOSIS — I129 Hypertensive chronic kidney disease with stage 1 through stage 4 chronic kidney disease, or unspecified chronic kidney disease: Secondary | ICD-10-CM | POA: Diagnosis present

## 2024-02-09 HISTORY — DX: Disorder of kidney and ureter, unspecified: N28.9

## 2024-02-09 HISTORY — DX: Atherosclerotic heart disease of native coronary artery without angina pectoris: I25.10

## 2024-02-09 LAB — URINALYSIS, ROUTINE W REFLEX MICROSCOPIC
Bilirubin Urine: NEGATIVE
Glucose, UA: >=500 mg/dL — AB
Ketones, ur: NEGATIVE mg/dL
Leukocytes,Ua: NEGATIVE
Nitrite: NEGATIVE
Protein, ur: >=300 mg/dL — AB
Specific Gravity, Urine: 1.01 (ref 1.005–1.030)
pH: 6 (ref 5.0–8.0)

## 2024-02-09 LAB — COMPREHENSIVE METABOLIC PANEL WITH GFR
ALT: 18 U/L (ref 0–44)
AST: 20 U/L (ref 15–41)
Albumin: 4 g/dL (ref 3.5–5.0)
Alkaline Phosphatase: 175 U/L — ABNORMAL HIGH (ref 38–126)
Anion gap: 10 (ref 5–15)
BUN: 45 mg/dL — ABNORMAL HIGH (ref 8–23)
CO2: 23 mmol/L (ref 22–32)
Calcium: 9.9 mg/dL (ref 8.9–10.3)
Chloride: 106 mmol/L (ref 98–111)
Creatinine, Ser: 3.28 mg/dL — ABNORMAL HIGH (ref 0.61–1.24)
GFR, Estimated: 18 mL/min — ABNORMAL LOW
Glucose, Bld: 133 mg/dL — ABNORMAL HIGH (ref 70–99)
Potassium: 4.8 mmol/L (ref 3.5–5.1)
Sodium: 139 mmol/L (ref 135–145)
Total Bilirubin: 0.5 mg/dL (ref 0.0–1.2)
Total Protein: 6.5 g/dL (ref 6.5–8.1)

## 2024-02-09 LAB — CBC
HCT: 38 % — ABNORMAL LOW (ref 39.0–52.0)
Hemoglobin: 12.9 g/dL — ABNORMAL LOW (ref 13.0–17.0)
MCH: 31.4 pg (ref 26.0–34.0)
MCHC: 33.9 g/dL (ref 30.0–36.0)
MCV: 92.5 fL (ref 80.0–100.0)
Platelets: 142 K/uL — ABNORMAL LOW (ref 150–400)
RBC: 4.11 MIL/uL — ABNORMAL LOW (ref 4.22–5.81)
RDW: 13.7 % (ref 11.5–15.5)
WBC: 9.7 K/uL (ref 4.0–10.5)
nRBC: 0 % (ref 0.0–0.2)

## 2024-02-09 LAB — GLUCOSE, CAPILLARY: Glucose-Capillary: 121 mg/dL — ABNORMAL HIGH (ref 70–99)

## 2024-02-09 LAB — TROPONIN T, HIGH SENSITIVITY
Troponin T High Sensitivity: 44 ng/L — ABNORMAL HIGH (ref 0–19)
Troponin T High Sensitivity: 28 ng/L — ABNORMAL HIGH (ref 0–19)

## 2024-02-09 LAB — LIPASE, BLOOD: Lipase: 71 U/L — ABNORMAL HIGH (ref 11–51)

## 2024-02-09 MED ORDER — AMLODIPINE BESYLATE 5 MG PO TABS
5.0000 mg | ORAL_TABLET | Freq: Once | ORAL | Status: AC
  Administered 2024-02-09: 5 mg via ORAL
  Filled 2024-02-09: qty 1

## 2024-02-09 MED ORDER — METOPROLOL SUCCINATE ER 50 MG PO TB24: 100.0000 mg | ORAL_TABLET | Freq: Every day | ORAL | Status: DC

## 2024-02-09 MED ORDER — MORPHINE SULFATE (PF) 4 MG/ML IV SOLN
4.0000 mg | Freq: Once | INTRAVENOUS | Status: AC
  Administered 2024-02-09: 4 mg via INTRAVENOUS
  Filled 2024-02-09: qty 1

## 2024-02-09 MED ORDER — POLYETHYLENE GLYCOL 3350 17 G PO PACK
17.0000 g | PACK | Freq: Every day | ORAL | Status: DC
  Administered 2024-02-11 – 2024-02-13 (×3): 17 g via ORAL
  Filled 2024-02-09 (×3): qty 1

## 2024-02-09 MED ORDER — MORPHINE SULFATE (PF) 2 MG/ML IV SOLN
2.0000 mg | INTRAVENOUS | Status: DC | PRN
  Administered 2024-02-10 (×2): 2 mg via INTRAVENOUS
  Filled 2024-02-09 (×3): qty 1

## 2024-02-09 MED ORDER — EMPAGLIFLOZIN 10 MG PO TABS
10.0000 mg | ORAL_TABLET | Freq: Every day | ORAL | Status: DC
  Administered 2024-02-11: 10 mg via ORAL
  Filled 2024-02-09 (×3): qty 1

## 2024-02-09 MED ORDER — LABETALOL HCL 5 MG/ML IV SOLN
20.0000 mg | Freq: Once | INTRAVENOUS | Status: AC
  Administered 2024-02-09: 20 mg via INTRAVENOUS
  Filled 2024-02-09: qty 4

## 2024-02-09 MED ORDER — IOHEXOL 300 MG/ML  SOLN
100.0000 mL | Freq: Once | INTRAMUSCULAR | Status: AC | PRN
  Administered 2024-02-09: 100 mL via INTRAVENOUS

## 2024-02-09 MED ORDER — ACETAMINOPHEN 325 MG PO TABS: 650.0000 mg | ORAL_TABLET | Freq: Four times a day (QID) | ORAL | Status: DC | PRN

## 2024-02-09 MED ORDER — METOPROLOL SUCCINATE ER 50 MG PO TB24
100.0000 mg | ORAL_TABLET | Freq: Every day | ORAL | Status: DC
  Administered 2024-02-10 – 2024-02-14 (×5): 100 mg via ORAL
  Filled 2024-02-09 (×3): qty 2

## 2024-02-09 MED ORDER — VITAMIN D 25 MCG (1000 UNIT) PO TABS
5000.0000 [IU] | ORAL_TABLET | Freq: Every day | ORAL | Status: DC
  Filled 2024-02-09 (×3): qty 5

## 2024-02-09 MED ORDER — TAMSULOSIN HCL 0.4 MG PO CAPS
0.4000 mg | ORAL_CAPSULE | Freq: Every day | ORAL | Status: DC
  Administered 2024-02-10 – 2024-02-13 (×4): 0.4 mg via ORAL
  Filled 2024-02-09 (×3): qty 1

## 2024-02-09 MED ORDER — INSULIN ASPART 100 UNIT/ML IJ SOLN
0.0000 [IU] | Freq: Four times a day (QID) | INTRAMUSCULAR | Status: DC
  Administered 2024-02-10: 2 [IU] via SUBCUTANEOUS
  Administered 2024-02-11: 3 [IU] via SUBCUTANEOUS
  Administered 2024-02-11 – 2024-02-13 (×3): 2 [IU] via SUBCUTANEOUS
  Filled 2024-02-09: qty 3
  Filled 2024-02-09: qty 1
  Filled 2024-02-09 (×2): qty 2
  Filled 2024-02-09: qty 1

## 2024-02-09 MED ORDER — ATORVASTATIN CALCIUM 20 MG PO TABS
40.0000 mg | ORAL_TABLET | Freq: Every day | ORAL | Status: DC
  Administered 2024-02-10 – 2024-02-14 (×5): 40 mg via ORAL
  Filled 2024-02-09 (×3): qty 2

## 2024-02-09 MED ORDER — ONDANSETRON HCL 4 MG PO TABS: 4.0000 mg | ORAL_TABLET | Freq: Four times a day (QID) | ORAL | Status: DC | PRN

## 2024-02-09 MED ORDER — SODIUM CHLORIDE 0.9 % IV SOLN: INTRAVENOUS | Status: AC

## 2024-02-09 MED ORDER — TRAZODONE HCL 50 MG PO TABS
25.0000 mg | ORAL_TABLET | Freq: Every evening | ORAL | Status: DC | PRN
  Administered 2024-02-09: 25 mg via ORAL
  Filled 2024-02-09: qty 1

## 2024-02-09 MED ORDER — AMLODIPINE BESYLATE 5 MG PO TABS
5.0000 mg | ORAL_TABLET | Freq: Every day | ORAL | Status: DC
  Administered 2024-02-10: 5 mg via ORAL
  Filled 2024-02-09: qty 1

## 2024-02-09 MED ORDER — FINASTERIDE 5 MG PO TABS
5.0000 mg | ORAL_TABLET | Freq: Every day | ORAL | Status: DC
  Administered 2024-02-10 – 2024-02-14 (×5): 5 mg via ORAL
  Filled 2024-02-09 (×3): qty 1

## 2024-02-09 MED ORDER — PANTOPRAZOLE SODIUM 40 MG IV SOLR
40.0000 mg | Freq: Once | INTRAVENOUS | Status: AC
  Administered 2024-02-09: 40 mg via INTRAVENOUS
  Filled 2024-02-09: qty 10

## 2024-02-09 MED ORDER — ONDANSETRON HCL 4 MG/2ML IJ SOLN
4.0000 mg | Freq: Once | INTRAMUSCULAR | Status: AC
  Administered 2024-02-09: 4 mg via INTRAVENOUS
  Filled 2024-02-09: qty 2

## 2024-02-09 MED ORDER — ALUM & MAG HYDROXIDE-SIMETH 200-200-20 MG/5ML PO SUSP
30.0000 mL | Freq: Once | ORAL | Status: AC
  Administered 2024-02-09: 30 mL via ORAL
  Filled 2024-02-09: qty 30

## 2024-02-09 MED ORDER — HYDRALAZINE HCL 20 MG/ML IJ SOLN
10.0000 mg | Freq: Four times a day (QID) | INTRAMUSCULAR | Status: DC | PRN
  Administered 2024-02-09 – 2024-02-14 (×6): 10 mg via INTRAVENOUS
  Filled 2024-02-09 (×2): qty 1

## 2024-02-09 MED ORDER — LIDOCAINE VISCOUS HCL 2 % MT SOLN
15.0000 mL | Freq: Once | OROMUCOSAL | Status: AC
  Administered 2024-02-09: 15 mL via ORAL
  Filled 2024-02-09: qty 15

## 2024-02-09 MED ORDER — MAGNESIUM HYDROXIDE 400 MG/5ML PO SUSP: 30.0000 mL | Freq: Every day | ORAL | Status: DC | PRN

## 2024-02-09 MED ORDER — ONDANSETRON HCL 4 MG/2ML IJ SOLN: 4.0000 mg | Freq: Four times a day (QID) | INTRAMUSCULAR | Status: DC | PRN

## 2024-02-09 MED ORDER — ALLOPURINOL 100 MG PO TABS
100.0000 mg | ORAL_TABLET | Freq: Every day | ORAL | Status: DC
  Administered 2024-02-10 – 2024-02-14 (×5): 100 mg via ORAL
  Filled 2024-02-09 (×3): qty 1

## 2024-02-09 MED ORDER — TIMOLOL MALEATE 0.5 % OP SOLN
1.0000 [drp] | Freq: Every day | OPHTHALMIC | Status: DC
  Administered 2024-02-10 – 2024-02-14 (×5): 1 [drp] via OPHTHALMIC
  Filled 2024-02-09: qty 5

## 2024-02-09 MED ORDER — ACETAMINOPHEN 650 MG RE SUPP: 650.0000 mg | Freq: Four times a day (QID) | RECTAL | Status: DC | PRN

## 2024-02-09 MED ORDER — LEVOTHYROXINE SODIUM 100 MCG PO TABS
200.0000 ug | ORAL_TABLET | Freq: Every day | ORAL | Status: DC
  Administered 2024-02-10 – 2024-02-14 (×5): 200 ug via ORAL
  Filled 2024-02-09 (×3): qty 2

## 2024-02-09 MED ORDER — CHLORHEXIDINE GLUCONATE CLOTH 2 % EX PADS
6.0000 | MEDICATED_PAD | Freq: Every day | CUTANEOUS | Status: DC
  Administered 2024-02-10: 6 via TOPICAL

## 2024-02-09 MED ORDER — LABETALOL HCL 5 MG/ML IV SOLN
20.0000 mg | INTRAVENOUS | Status: DC | PRN
  Administered 2024-02-09 – 2024-02-10 (×2): 20 mg via INTRAVENOUS
  Filled 2024-02-09 (×2): qty 4

## 2024-02-09 MED ORDER — PIPERACILLIN-TAZOBACTAM 3.375 G IVPB 30 MIN
3.3750 g | Freq: Once | INTRAVENOUS | Status: AC
  Administered 2024-02-09: 3.375 g via INTRAVENOUS
  Filled 2024-02-09: qty 50

## 2024-02-09 MED ORDER — SODIUM CHLORIDE 0.9 % IV BOLUS
1000.0000 mL | Freq: Once | INTRAVENOUS | Status: AC
  Administered 2024-02-09: 1000 mL via INTRAVENOUS

## 2024-02-09 MED ORDER — METOPROLOL SUCCINATE ER 50 MG PO TB24
100.0000 mg | ORAL_TABLET | Freq: Once | ORAL | Status: AC
  Administered 2024-02-09: 100 mg via ORAL
  Filled 2024-02-09: qty 2

## 2024-02-09 NOTE — H&P (Signed)
 "     Alec White   PATIENT NAME: Alec White    MR#:  [id number]  DATE OF BIRTH:  May 31, 1940  DATE OF ADMISSION:  02/09/2024  PRIMARY CARE PHYSICIAN: Cleotilde Oneil FALCON, MD   Patient is coming from: Home  REQUESTING/REFERRING PHYSICIAN: Waymond Lorelle Cummins, MD    CHIEF COMPLAINT:   Chief Complaint  Patient presents with   Abdominal Pain    HISTORY OF PRESENT ILLNESS:  Alec White is a 83 y.o. Caucasian male with medical history significant for coronary artery disease, stage 4 chronic kidney disease, essential hypertension, hypothyroidism, gout and dyslipidemia as well as type 2 diabetes mellitus, who the emergency room with acute onset of epigastric and right upper quadrant abdominal pain that started overnight with crampy pain since this a.m. he felt excessive acidity but denies any nausea or vomiting or diarrhea but he had constipation last night.  No chest pain or palpitations.  No dyspnea or cough or wheezing.  No dysuria, oliguria, urinary frequency or urgency or hematuria or flank pain.  ED Course: When the patient came to the ER, BP was 195/86 temperature 97.5 with heart rate of 58 and otherwise normal vital signs.  Labs revealed a BUN of 45 and creatinine 3.28 compared to 15 and 3.86 on 12/08/2023 and 46/2.65 on 11/27/2022.  High-sensitivity troponin T was 44 and later 28.  CBC showed hemoglobin of 12.9 hematocrit 38 above previous levels. EKG as reviewed by me : EKG showed normal sinus rhythm with a rate of 61 with Q waves inferiorly Imaging: Abdominal and pelvic CT scan without contrast revealed the following: 1. Cholelithiasis with gallbladder distention, suspicious for acute cholecystitis. Consider right upper quadrant ultrasound. 2. Bilateral nonobstructive nephrolithiasis. 3. Prostatomegaly with mild bladder wall irregularity, suggesting a component of outlet obstruction. 4. Incidental findings include aortic atherosclerosis (ICD10-I70.0) and coronary artery  atherosclerosis.  The patient was given 1 L bolus of IV normal saline, 40 mg of IV Protonix , 4 mg of IV Zofran  as well as 4 mg of IV morphine  sulfate twice, Toprol -XL 100 mg p.o. and 20 mg of IV labetalol , 5 mg of p.o. Norvasc  and GI cocktail.  The patient will be admitted to a medical-surgical bed for further evaluation and management. PAST MEDICAL HISTORY:   Past Medical History:  Diagnosis Date   Coronary artery disease    stents placed 20 years ago   Renal disorder    CKD stage 4    PAST SURGICAL HISTORY:   Past Surgical History:  Procedure Laterality Date   APPENDECTOMY     HIP PINNING,CANNULATED Left 11/26/2022   Procedure: PERCUTANEOUS FIXATION OF FEMORAL NECK;  Surgeon: Tobie Priest, MD;  Location: [facility] ORS;  Service: Orthopedics;  Laterality: Left;   spinal neurostimulator  08/31/2017    SOCIAL HISTORY:   Social History   Tobacco Use   Smoking status: Never   Smokeless tobacco: Never  Substance Use Topics   Alcohol use: Not Currently    FAMILY HISTORY:   His mother died from intracranial hemorrhage at age of 22.  DRUG ALLERGIES:  Allergies[1]  REVIEW OF SYSTEMS:   ROS As per history of present illness. All pertinent systems were reviewed above. Constitutional, HEENT, cardiovascular, respiratory, GI, GU, musculoskeletal, neuro, psychiatric, endocrine, integumentary and hematologic systems were reviewed and are otherwise negative/unremarkable except for positive findings mentioned above in the HPI.   MEDICATIONS AT HOME:   Prior to Admission medications  Medication Sig Start Date End Date Taking? Authorizing Provider  acetaminophen  (TYLENOL ) 325 MG tablet Take 1-2 tablets (325-650 mg total) by mouth every 6 (six) hours as needed for mild pain (pain score 1-3 or temp > 100.5). 11/27/22   Josette Ade, MD  allopurinol  (ZYLOPRIM ) 100 MG tablet Take 100 mg by mouth daily.    [provider]  amLODipine  (NORVASC ) 5 MG tablet Take 5 mg by mouth  daily.    [provider]  aspirin  EC 81 MG tablet Take 1 tablet (81 mg total) by mouth 2 (two) times daily. Swallow whole. 11/27/22   Josette Ade, MD  atorvastatin  (LIPITOR) 40 MG tablet Take 40 mg by mouth daily.    [provider]  cholecalciferol  (VITAMIN D3) 25 MCG (1000 UNIT) tablet Take 5,000 Units by mouth daily.    [provider]  empagliflozin  (JARDIANCE ) 10 MG TABS tablet Take 10 mg by mouth daily.    [provider]  finasteride  (PROSCAR ) 5 MG tablet Take 5 mg by mouth daily.    [provider]  levothyroxine  (SYNTHROID ) 200 MCG tablet Take 200 mcg by mouth daily before breakfast.    [provider]  metoprolol  succinate (TOPROL -XL) 100 MG 24 hr tablet Take 100 mg by mouth daily. Take with or immediately following a meal.    [provider]  polyethylene glycol (MIRALAX  / GLYCOLAX ) 17 g packet Take 17 g by mouth daily. 11/28/22   Josette Ade, MD  tamsulosin  (FLOMAX ) 0.4 MG CAPS capsule Take 0.4 mg by mouth.    [provider]  timolol  (BETIMOL ) 0.5 % ophthalmic solution Place 1 drop into both eyes daily.    [provider]      VITAL SIGNS:  Blood pressure (!) 178/91, pulse 70, temperature 97.8 F (36.6 C), resp. rate (!) 23, height 5' 7 (1.702 m), weight 82.1 kg, SpO2 100%.  PHYSICAL EXAMINATION:  Physical Exam  GENERAL:  83 y.o.-year-old Caucasian male patient lying in the bed with no acute distress.  EYES: Pupils equal, round, reactive to light and accommodation. No scleral icterus. Extraocular muscles intact.  HEENT: Head atraumatic, normocephalic. Oropharynx and nasopharynx clear.  NECK:  Supple, no jugular venous distention. No thyroid enlargement, no tenderness.  LUNGS: Normal breath sounds bilaterally, no wheezing, rales,rhonchi or crepitation. No use of accessory muscles of respiration.  CARDIOVASCULAR: Regular rate and rhythm, S1, S2 normal. No murmurs, rubs, or gallops.   ABDOMEN: Soft, nondistended, with epigastric and right upper quadrant abdominal tenderness without rebound tenderness guarding or rigidity.SABRA  Positive Murphy sign. Bowel sounds present. No organomegaly or mass.  EXTREMITIES: No pedal edema, cyanosis, or clubbing.  NEUROLOGIC: Cranial nerves II through XII are intact. Muscle strength 5/5 in all extremities. Sensation intact. Gait not checked.  PSYCHIATRIC: The patient is alert and oriented x 3.  Normal affect and good eye contact. SKIN: No obvious rash, lesion, or ulcer.   LABORATORY PANEL:   CBC Recent Labs  Lab 02/09/24 1226  WBC 9.7  HGB 12.9*  HCT 38.0*  PLT 142*   ------------------------------------------------------------------------------------------------------------------  Chemistries  Recent Labs  Lab 02/09/24 1226  NA 139  K 4.8  CL 106  CO2 23  GLUCOSE 133*  BUN 45*  CREATININE 3.28*  CALCIUM  9.9  AST 20  ALT 18  ALKPHOS 175*  BILITOT 0.5   ------------------------------------------------------------------------------------------------------------------  Cardiac Enzymes No results for input(s): TROPONINI in the last 168 hours. ------------------------------------------------------------------------------------------------------------------  RADIOLOGY:  US  Abdomen Limited RUQ (LIVER/GB) Result Date: 02/09/2024 CLINICAL DATA:  Upper abdominal pain. EXAM: ULTRASOUND ABDOMEN LIMITED RIGHT  UPPER QUADRANT COMPARISON:  None Available. FINDINGS: Gallbladder: Multiple shadowing echogenic gallstones are seen within the gallbladder lumen. The largest measures approximately 1.3 cm. There is no evidence of gallbladder wall thickening (2.4 mm). No sonographic Murphy sign noted by sonographer. Common bile duct: Diameter: 2.1 mm Liver: The left lobe of the liver is poorly visualized secondary to overlying bowel gas. No focal lesion identified. Within normal limits in parenchymal echogenicity. Portal vein is patent on  color Doppler imaging with normal direction of blood flow towards the liver. Other: None. IMPRESSION: Cholelithiasis without evidence of acute cholecystitis. Electronically Signed   By: Suzen Dials M.D.   On: 02/09/2024 16:42   CT ABDOMEN PELVIS WO CONTRAST Result Date: 02/09/2024 EXAM: CT ABDOMEN AND PELVIS WITHOUT CONTRAST 02/09/2024 03:27:50 PM TECHNIQUE: CT of the abdomen and pelvis was performed without the administration of intravenous contrast. Multiplanar reformatted images are provided for review. Automated exposure control, iterative reconstruction, and/or weight-based adjustment of the mA/kV was utilized to reduce the radiation dose to as low as reasonably achievable. COMPARISON: 07/25/2018 CLINICAL HISTORY: Abdominal pain, acute, nonlocalized. Bilateral cramping upper abdominal pain since this morning. FINDINGS: LOWER CHEST: Bibasilar scarring. Mild cardiomegaly. Multivessel coronary artery calcification. LIVER: Subcentimeter central left hepatic lobe cysts. No intrahepatic biliary duct dilatation. GALLBLADDER AND BILE DUCTS: Gallstones up to 8 mm. Mild-to-moderate gallbladder distention. No biliary ductal dilatation. SPLEEN: Normal in size and morphology. PANCREAS: Normal, without duct dilatation or acute inflammation. ADRENAL GLANDS: Normal, without mass. KIDNEYS, URETERS AND BLADDER: Bilateral punctate renal collecting system calculi. Subcentimeter interpolar left renal angiomyolipoma. Bilateral low-density renal lesions are likely cysts at up to 2.6 cm. An interpolar right renal 1.8 cm lesion measures 21 HU, likely a minimally complex cyst. No follow up indicated. Mild bilateral renal cortical thinning. No hydronephrosis. No hydroureter or ureteric stone. No bladder calculi. Mild bladder wall irregularity. GI AND BOWEL: Normal stomach, without wall thickening. Normal small bowel caliber. Normal colon and terminal ileum. Appendix not visualized. PERITONEUM AND RETROPERITONEUM: No ascites. No  free air. VASCULATURE: Aorta is normal in caliber. Aortic atherosclerosis. LYMPH NODES: No lymphadenopathy. REPRODUCTIVE ORGANS: Moderate prostatomegaly. BONES AND SOFT TISSUES: Left proximal femur fixation. Lumbosacral spine fixation. Dorsal spinal stimulator. Thoracolumbar spondylosis. Beam hardening artifact from lumbar spine hardware and battery for dorsal spinal stimulator. IMPRESSION: 1. Cholelithiasis with gallbladder distention, suspicious for acute cholecystitis. Consider right upper quadrant ultrasound. 2. Bilateral nonobstructive nephrolithiasis. 3. Prostatomegaly with mild bladder wall irregularity, suggesting a component of outlet obstruction. 4. Incidental findings include aortic atherosclerosis (ICD10-I70.0) and coronary artery atherosclerosis. Electronically signed by: Rockey Kilts MD 02/09/2024 03:36 PM EST RP Workstation: [id number]      IMPRESSION AND PLAN:  Assessment and Plan: * Symptomatic cholelithiasis - The patient will be admitted to a medical-surgical bed. - Pain management will be provided. - She will be placed on p.o. clear liquids and will be n.p.o. after midnight. - General Surgery consult will be obtained. - I notified Dr. Jordis about the patient. - The patient had a neurostimulator that prevented MRCP and given normal LFTs and lack of clear sign of active biliary obstruction, MRCP was not thought to be necessary at this time per Dr. Jordis.  Hypertensive urgency - This could be related to pain. - Pain management will be provided. - Will be placed on as needed IV labetalol . - Will resume antihypertensive therapy.  Acute kidney injury superimposed on chronic kidney disease - This could be related to mild volume depletion and dehydration. - The patient will be hydrated  with IV normal saline. - Will follow BMP. - Will avoid nephrotoxins.    Type 2 diabetes mellitus with chronic kidney disease, without long-term current use of insulin  (HCC) - The patient will be  placed on supplemental coverage with NovoLog . - Will continue Jardiance .  BPH (benign prostatic hyperplasia) - Will continue Flomax  and Proscar .  Dyslipidemia - We will continue statin therapy.  Hypothyroidism - Will continue Synthroid    DVT prophylaxis: SCDs.  Medical prophylaxis is postponed for potential expected operative intervention. Advanced Care Planning:  Code Status: full code. Family Communication:  The plan of care was discussed in details with the patient (and family). I answered all questions. The patient agreed to proceed with the above mentioned plan. Further management will depend upon hospital course. Disposition Plan: Back to previous home environment Consults called: General Surgery All the records are reviewed and case discussed with ED provider.  Status is: Inpatient  At the time of the admission, it appears that the appropriate admission status for this patient is inpatient.  This is judged to be reasonable and necessary in order to provide the required intensity of service to ensure the patient's safety given the presenting symptoms, physical exam findings and initial radiographic and laboratory data in the context of comorbid conditions.  The patient requires inpatient status due to high intensity of service, high risk of further deterioration and high frequency of surveillance required.  I certify that at the time of admission, it is my clinical judgment that the patient will require inpatient hospital care extending more than 2 midnights.                            Dispo: The patient is from: Home              Anticipated d/c is to: Home              Patient currently is not medically stable to d/c.              Difficult to place patient: No  Madison DELENA Peaches M.D on 02/09/2024 at 9:16 PM  [facility]   From 7 PM-7 AM, contact night-coverage [email or web address]  CC: Primary care physician; Cleotilde Oneil FALCON, MD     [1]  Allergies Allergen Reactions    Tramadol  Other (See Comments)    Keeps him wide awake   "

## 2024-02-09 NOTE — Assessment & Plan Note (Signed)
-   Will continue Synthroid .

## 2024-02-09 NOTE — Assessment & Plan Note (Signed)
-   The patient will be placed on supplemental coverage with NovoLog. - Will continue Jardiance.

## 2024-02-09 NOTE — Assessment & Plan Note (Signed)
-   This could be related to mild volume depletion and dehydration. - The patient will be hydrated with IV normal saline. - Will follow BMP. - Will avoid nephrotoxins.

## 2024-02-09 NOTE — Assessment & Plan Note (Signed)
-   Will continue Flomax  and Proscar .

## 2024-02-09 NOTE — Assessment & Plan Note (Signed)
 -  We will continue statin therapy.

## 2024-02-09 NOTE — Assessment & Plan Note (Signed)
-   This could be related to pain. - Pain management will be provided. - Will be placed on as needed IV labetalol . - Will resume antihypertensive therapy.

## 2024-02-09 NOTE — Assessment & Plan Note (Signed)
-   The patient will be admitted to a medical-surgical bed. - Pain management will be provided. - She will be placed on p.o. clear liquids and will be n.p.o. after midnight. - General Surgery consult will be obtained. - I notified Dr. Jordis about the patient. - The patient had a neurostimulator that prevented MRCP and given normal LFTs and lack of clear sign of active biliary obstruction, MRCP was not thought to be necessary at this time per Dr. Jordis.

## 2024-02-09 NOTE — ED Provider Notes (Addendum)
 "  [facility] Provider Note    Event Date/Time   First MD Initiated Contact with Patient 02/09/24 1400     (approximate)   History   Abdominal Pain   HPI  Alec White is a 83 y.o. male with history of CAD, diabetes, hypertension who presents with complaints of abdominal pain.  Patient reports that he is having periumbilical abdominal pain that is severe and started overnight.  No fevers or chills.  No diarrhea     Physical Exam   Triage Vital Signs: ED Triage Vitals  Encounter Vitals Group     BP 02/09/24 1221 (!) 195/86     Girls Systolic BP Percentile --      Girls Diastolic BP Percentile --      Boys Systolic BP Percentile --      Boys Diastolic BP Percentile --      Pulse Rate 02/09/24 1220 (!) 58     Resp 02/09/24 1220 20     Temp 02/09/24 1220 (!) 97.5 F (36.4 C)     Temp Source 02/09/24 1220 Oral     SpO2 02/09/24 1220 96 %     Weight 02/09/24 1224 82.1 kg (181 lb)     Height 02/09/24 1224 1.702 m (5' 7)     Head Circumference --      Peak Flow --      Pain Score 02/09/24 1220 5     Pain Loc --      Pain Education --      Exclude from Growth Chart --     Most recent vital signs: Vitals:   02/09/24 1221 02/09/24 1500  BP: (!) 195/86 (!) 235/86  Pulse:    Resp:    Temp:    SpO2:       General: Awake CV:  Good peripheral perfusion.  Resp:  Normal effort.  Abd:  No distention.  Mild tenderness periumbilically, but abdomen soft overall no abdominal bruit noted, no pulsatile mass pulses equal in the lower extremities Other:     ED Results / Procedures / Treatments   Labs (all labs ordered are listed, but only abnormal results are displayed) Labs Reviewed  LIPASE, BLOOD - Abnormal; Notable for the following components:      Result Value   Lipase 71 (*)    All other components within normal limits  COMPREHENSIVE METABOLIC PANEL WITH GFR - Abnormal; Notable for the following components:   Glucose, Bld 133 (*)    BUN  45 (*)    Creatinine, Ser 3.28 (*)    Alkaline Phosphatase 175 (*)    GFR, Estimated 18 (*)    All other components within normal limits  CBC - Abnormal; Notable for the following components:   RBC 4.11 (*)    Hemoglobin 12.9 (*)    HCT 38.0 (*)    Platelets 142 (*)    All other components within normal limits  URINALYSIS, ROUTINE W REFLEX MICROSCOPIC     EKG  ED ECG REPORT I, Lamar Price, the attending physician, personally viewed and interpreted this ECG.  Date: 02/09/2024  Rhythm: normal sinus rhythm QRS Axis: normal Intervals: normal ST/T Wave abnormalities: normal Narrative Interpretation: no evidence of acute ischemia    RADIOLOGY Pending CT abdomen pelvis    PROCEDURES:  Critical Care performed:   Procedures   MEDICATIONS ORDERED IN ED: Medications  labetalol  (NORMODYNE ) injection 20 mg (has no administration in time range)  morphine  (PF) 4 MG/ML injection  4 mg (4 mg Intravenous Given 02/09/24 1501)  ondansetron  (ZOFRAN ) injection 4 mg (4 mg Intravenous Given 02/09/24 1458)  iohexol  (OMNIPAQUE ) 300 MG/ML solution 100 mL (100 mLs Intravenous Contrast Given 02/09/24 1513)     IMPRESSION / MDM / ASSESSMENT AND PLAN / ED COURSE  I reviewed the triage vital signs and the nursing notes. Patient's presentation is most consistent with acute presentation with potential threat to life or bodily function.  Patient presents with periumbilical abdominal pain as detailed above, he describes the pain as severe.  Differential includes pancreatitis, gastritis, diverticulitis/colitis  Will treat with IV morphine , IV Zofran .  Notably the patient is quite hypertensive, will give a dose of IV labetalol  as well I suspect a large portion of his blood pressure is related to pain.  Lab work reviewed and is overall not significant change from prior.  Pending CT abdomen pelvis.  Have asked my colleague to follow-up on CT results and reevaluate  CT concerning for  cholecystitis, pending US   Have asked my colleague to follow up on US .      FINAL CLINICAL IMPRESSION(S) / ED DIAGNOSES   Final diagnoses:  Periumbilical abdominal pain     Rx / DC Orders   ED Discharge Orders     None        Note:  This document was prepared using Dragon voice recognition software and may include unintentional dictation errors.   Alec Charleston, MD 02/09/24 1520    Alec Charleston, MD 02/09/24 1549  "

## 2024-02-09 NOTE — Progress Notes (Signed)
 ED Pharmacy Antibiotic Sign Off An antibiotic consult was received from an ED provider for Zosyn  per pharmacy dosing for Intra-abdominal infection. A chart review was completed to assess appropriateness.   The following one time order(s) were placed:  Zosyn  3.375 g IV x1  Further antibiotic and/or antibiotic pharmacy consults should be ordered by the admitting provider if indicated.   Thank you for allowing pharmacy to be a part of this patient's care.   Damien Napoleon, [facility]  Clinical Pharmacist 02/09/2024 6:50 PM

## 2024-02-09 NOTE — ED Triage Notes (Signed)
 Pt to ED POV for bilateral cramping upper abdominal pain since this AM. States he woke up feeling like acid there. Denies NVD and constipation. LBM this AM. Has gallbladder. Respirations unlabored.

## 2024-02-09 NOTE — ED Provider Notes (Signed)
 .----------------------------------------- 3:47 PM on 02/09/2024 -----------------------------------------  Blood pressure (!) 235/86, pulse (!) 58, temperature (!) 97.5 F (36.4 C), temperature source Oral, resp. rate 20, height 5' 7 (1.702 m), weight 82.1 kg, SpO2 96%.  Assuming care from Dr. Arlander.  In short, Alec White is a 83 y.o. male with a chief complaint of abd pain.  Refer to the original H&P for additional details.  The current plan of care is to follow-up on right upper quadrant ultrasound.  EKG shows sinus rhythm, rate 63, normal QRS, normal QTc, nonspecific intraventricular conduction delay, T wave inversion to aVL, not significantly changed compared to prior  Clinical course as below.  Patient unable to get MRI due to neurostimulator.  Admitted to hospitalist team.  Surgery will see him tomorrow for reassessment.   Clinical Course as of 02/09/24 2029  Thu Feb 09, 2024  1612 On assessment his repeat blood pressure is 190/74, no chest pain.  He is complaining about upper abdominal pain.  Is getting gallbladder ultrasound done at this time.  Will put in for some additional IV morphine  for him. [TT]  1655 US  Abdomen Limited RUQ (LIVER/GB) Cholelithiasis without evidence of acute cholecystitis.  [TT]  1723 Will back to reassessed patient after ultrasound results, he still has right upper quadrant abdominal pain, did discuss with him about admission for biliary colic, patient would like to try another round of pain medications, will also give a GI cocktail and reassess.  No chest pain or shortness of breath at this time. [TT]  1751 Also states he did not take his blood pressure kidney medications this morning.  Will give him his home dose of BP meds. [TT]  1843 On reassessment patient is still saying that his pain is persistent, repeat blood pressures in the room is 170s.  Given his persistent biliary colic, will discuss with surgery about admission. [TT]  1847 Consulted  surgery, who recommended admission to hospitalist, MRCP, starting him on IV antibiotics for coverage given persistent right upper quadrant pain.  States that if no pancreatitis or choledocholithiasis, will likely need surgery for gallbladder removal. [TT]  8082 Did consult hospitalist for admission but Dr. Lawence wants to wait for MRCP to return in case patient needs an ERCP. [TT]  2014 Patient has a neurostimulator, MRI looked into it, it is old, unable to safely do an MRI.  Did discuss with Dr. Jordis, they will follow him tomorrow with labs and go from there.  Will DC the MRCP. [TT]    Clinical Course User Index [TT] Waymond Lorelle Cummins, MD     Medications  morphine  (PF) 4 MG/ML injection 4 mg (4 mg Intravenous Given 02/09/24 1501)  ondansetron  (ZOFRAN ) injection 4 mg (4 mg Intravenous Given 02/09/24 1458)  iohexol  (OMNIPAQUE ) 300 MG/ML solution 100 mL (100 mLs Intravenous Contrast Given 02/09/24 1513)  labetalol  (NORMODYNE ) injection 20 mg (20 mg Intravenous Given 02/09/24 1600)  morphine  (PF) 4 MG/ML injection 4 mg (4 mg Intravenous Given 02/09/24 1635)  alum & mag hydroxide-simeth (MAALOX/MYLANTA) 200-200-20 MG/5ML suspension 30 mL (30 mLs Oral Given 02/09/24 1726)    And  lidocaine  (XYLOCAINE ) 2 % viscous mouth solution 15 mL (15 mLs Oral Given 02/09/24 1727)  pantoprazole  (PROTONIX ) injection 40 mg (40 mg Intravenous Given 02/09/24 1726)  amLODipine  (NORVASC ) tablet 5 mg (5 mg Oral Given 02/09/24 1757)  metoprolol  succinate (TOPROL -XL) 24 hr tablet 100 mg (100 mg Oral Given 02/09/24 1757)  piperacillin -tazobactam (ZOSYN ) IVPB 3.375 g (0 g Intravenous Stopped 02/09/24  1931)  sodium chloride  0.9 % bolus 1,000 mL (1,000 mLs Intravenous New Bag/Given 02/09/24 2004)     ED Discharge Orders     None      Final diagnoses:  Periumbilical abdominal pain  RUQ pain  Biliary colic  Hypertension, unspecified type      Waymond Lorelle Cummins, MD 02/09/24 2029

## 2024-02-10 ENCOUNTER — Inpatient Hospital Stay: Admitting: Anesthesiology

## 2024-02-10 ENCOUNTER — Encounter: Admission: EM | Disposition: A | Discharge status: Home / Self Care | Payer: Self-pay | Source: Home / Self Care

## 2024-02-10 ENCOUNTER — Encounter: Payer: Self-pay | Admitting: Family Medicine

## 2024-02-10 DIAGNOSIS — K8001 Calculus of gallbladder with acute cholecystitis with obstruction: Secondary | ICD-10-CM

## 2024-02-10 DIAGNOSIS — K8000 Calculus of gallbladder with acute cholecystitis without obstruction: Secondary | ICD-10-CM

## 2024-02-10 DIAGNOSIS — K810 Acute cholecystitis: Secondary | ICD-10-CM | POA: Diagnosis not present

## 2024-02-10 DIAGNOSIS — K8020 Calculus of gallbladder without cholecystitis without obstruction: Secondary | ICD-10-CM | POA: Diagnosis not present

## 2024-02-10 LAB — GLUCOSE, CAPILLARY
Glucose-Capillary: 107 mg/dL — ABNORMAL HIGH (ref 70–99)
Glucose-Capillary: 113 mg/dL — ABNORMAL HIGH (ref 70–99)
Glucose-Capillary: 116 mg/dL — ABNORMAL HIGH (ref 70–99)
Glucose-Capillary: 137 mg/dL — ABNORMAL HIGH (ref 70–99)
Glucose-Capillary: 145 mg/dL — ABNORMAL HIGH (ref 70–99)
Glucose-Capillary: 98 mg/dL (ref 70–99)

## 2024-02-10 LAB — CBC
HCT: 36.3 % — ABNORMAL LOW (ref 39.0–52.0)
Hemoglobin: 12.4 g/dL — ABNORMAL LOW (ref 13.0–17.0)
MCH: 31.3 pg (ref 26.0–34.0)
MCHC: 34.2 g/dL (ref 30.0–36.0)
MCV: 91.7 fL (ref 80.0–100.0)
Platelets: 120 K/uL — ABNORMAL LOW (ref 150–400)
RBC: 3.96 MIL/uL — ABNORMAL LOW (ref 4.22–5.81)
RDW: 13.7 % (ref 11.5–15.5)
WBC: 10 K/uL (ref 4.0–10.5)
nRBC: 0 % (ref 0.0–0.2)

## 2024-02-10 LAB — HEMOGLOBIN A1C
Hgb A1c MFr Bld: 5.5 % (ref 4.8–5.6)
Mean Plasma Glucose: 111.15 mg/dL

## 2024-02-10 LAB — COMPREHENSIVE METABOLIC PANEL WITH GFR
ALT: 15 U/L (ref 0–44)
AST: 18 U/L (ref 15–41)
Albumin: 3.7 g/dL (ref 3.5–5.0)
Alkaline Phosphatase: 160 U/L — ABNORMAL HIGH (ref 38–126)
Anion gap: 10 (ref 5–15)
BUN: 42 mg/dL — ABNORMAL HIGH (ref 8–23)
CO2: 21 mmol/L — ABNORMAL LOW (ref 22–32)
Calcium: 9.6 mg/dL (ref 8.9–10.3)
Chloride: 106 mmol/L (ref 98–111)
Creatinine, Ser: 3.21 mg/dL — ABNORMAL HIGH (ref 0.61–1.24)
GFR, Estimated: 19 mL/min — ABNORMAL LOW
Glucose, Bld: 128 mg/dL — ABNORMAL HIGH (ref 70–99)
Potassium: 4.9 mmol/L (ref 3.5–5.1)
Sodium: 137 mmol/L (ref 135–145)
Total Bilirubin: 0.7 mg/dL (ref 0.0–1.2)
Total Protein: 6.1 g/dL — ABNORMAL LOW (ref 6.5–8.1)

## 2024-02-10 LAB — LIPASE, BLOOD: Lipase: 40 U/L (ref 11–51)

## 2024-02-10 MED ORDER — HYDRALAZINE HCL 20 MG/ML IJ SOLN
INTRAMUSCULAR | Status: AC
  Filled 2024-02-10: qty 1

## 2024-02-10 MED ORDER — LABETALOL HCL 5 MG/ML IV SOLN
INTRAVENOUS | Status: AC
  Filled 2024-02-10: qty 4

## 2024-02-10 MED ORDER — ALBUMIN HUMAN 25 % IV SOLN
25.0000 g | Freq: Once | INTRAVENOUS | Status: DC
  Filled 2024-02-10: qty 100

## 2024-02-10 MED ORDER — SUCCINYLCHOLINE CHLORIDE 200 MG/10ML IV SOSY
PREFILLED_SYRINGE | INTRAVENOUS | Status: AC
  Filled 2024-02-10: qty 10

## 2024-02-10 MED ORDER — OXYCODONE HCL 5 MG/5ML PO SOLN: 5.0000 mg | Freq: Once | ORAL | Status: DC | PRN

## 2024-02-10 MED ORDER — SUGAMMADEX SODIUM 200 MG/2ML IV SOLN
INTRAVENOUS | Status: DC | PRN
  Administered 2024-02-10: 200 mg via INTRAVENOUS

## 2024-02-10 MED ORDER — PIPERACILLIN-TAZOBACTAM 3.375 G IVPB 30 MIN: 3.3750 g | Freq: Once | INTRAVENOUS | Status: DC

## 2024-02-10 MED ORDER — PANTOPRAZOLE SODIUM 40 MG IV SOLR
40.0000 mg | Freq: Two times a day (BID) | INTRAVENOUS | Status: DC
  Administered 2024-02-10 – 2024-02-14 (×8): 40 mg via INTRAVENOUS
  Filled 2024-02-10 (×5): qty 10

## 2024-02-10 MED ORDER — FENTANYL CITRATE (PF) 100 MCG/2ML IJ SOLN
INTRAMUSCULAR | Status: AC
  Filled 2024-02-10: qty 2

## 2024-02-10 MED ORDER — ACETAMINOPHEN 10 MG/ML IV SOLN
INTRAVENOUS | Status: DC | PRN
  Administered 2024-02-10: 1000 mg via INTRAVENOUS

## 2024-02-10 MED ORDER — KETOROLAC TROMETHAMINE 30 MG/ML IJ SOLN
INTRAMUSCULAR | Status: AC
  Filled 2024-02-10: qty 1

## 2024-02-10 MED ORDER — FENTANYL CITRATE (PF) 100 MCG/2ML IJ SOLN: 25.0000 ug | INTRAMUSCULAR | Status: DC | PRN

## 2024-02-10 MED ORDER — PIPERACILLIN-TAZOBACTAM 3.375 G IVPB
INTRAVENOUS | Status: AC
  Filled 2024-02-10: qty 50

## 2024-02-10 MED ORDER — ONDANSETRON HCL 4 MG/2ML IJ SOLN
INTRAMUSCULAR | Status: DC | PRN
  Administered 2024-02-10: 4 mg via INTRAVENOUS

## 2024-02-10 MED ORDER — LACTATED RINGERS IV SOLN: INTRAVENOUS | Status: DC | PRN

## 2024-02-10 MED ORDER — PROPOFOL 10 MG/ML IV BOLUS
INTRAVENOUS | Status: DC | PRN
  Administered 2024-02-10: 150 mg via INTRAVENOUS

## 2024-02-10 MED ORDER — ALBUMIN HUMAN 5 % IV SOLN: INTRAVENOUS | Status: DC | PRN

## 2024-02-10 MED ORDER — PHENYLEPHRINE HCL-NACL 20-0.9 MG/250ML-% IV SOLN
INTRAVENOUS | Status: DC | PRN
  Administered 2024-02-10: 75 ug/min via INTRAVENOUS

## 2024-02-10 MED ORDER — OXYCODONE HCL 5 MG PO TABS: 5.0000 mg | ORAL_TABLET | Freq: Once | ORAL | Status: DC | PRN

## 2024-02-10 MED ORDER — ONDANSETRON HCL 4 MG/2ML IJ SOLN
INTRAMUSCULAR | Status: AC
  Filled 2024-02-10: qty 2

## 2024-02-10 MED ORDER — EPHEDRINE 5 MG/ML INJ
INTRAVENOUS | Status: AC
  Filled 2024-02-10: qty 5

## 2024-02-10 MED ORDER — LIDOCAINE HCL (PF) 2 % IJ SOLN
INTRAMUSCULAR | Status: AC
  Filled 2024-02-10: qty 5

## 2024-02-10 MED ORDER — ACETAMINOPHEN 500 MG PO TABS
1000.0000 mg | ORAL_TABLET | Freq: Four times a day (QID) | ORAL | Status: DC
  Administered 2024-02-10 – 2024-02-14 (×11): 1000 mg via ORAL
  Filled 2024-02-10 (×10): qty 2

## 2024-02-10 MED ORDER — LIDOCAINE HCL (PF) 2 % IJ SOLN
INTRAMUSCULAR | Status: DC | PRN
  Administered 2024-02-10: 80 mg via INTRADERMAL

## 2024-02-10 MED ORDER — 0.9 % SODIUM CHLORIDE (POUR BTL) OPTIME
TOPICAL | Status: DC | PRN
  Administered 2024-02-10: 500 mL

## 2024-02-10 MED ORDER — LABETALOL HCL 5 MG/ML IV SOLN
INTRAVENOUS | Status: DC | PRN
  Administered 2024-02-10: 2.5 mg via INTRAVENOUS

## 2024-02-10 MED ORDER — BUPIVACAINE-EPINEPHRINE (PF) 0.25% -1:200000 IJ SOLN
INTRAMUSCULAR | Status: DC | PRN
  Administered 2024-02-10: 30 mL

## 2024-02-10 MED ORDER — FENTANYL CITRATE (PF) 100 MCG/2ML IJ SOLN
INTRAMUSCULAR | Status: DC | PRN
  Administered 2024-02-10 (×2): 25 ug via INTRAVENOUS
  Administered 2024-02-10: 50 ug via INTRAVENOUS

## 2024-02-10 MED ORDER — INDOCYANINE GREEN 25 MG IJ SOLR
1.2500 mg | Freq: Once | INTRAMUSCULAR | Status: AC
  Administered 2024-02-10: 1.25 mg via INTRAVENOUS

## 2024-02-10 MED ORDER — PROPOFOL 10 MG/ML IV BOLUS
INTRAVENOUS | Status: AC
  Filled 2024-02-10: qty 20

## 2024-02-10 MED ORDER — PIPERACILLIN-TAZOBACTAM IN DEX 2-0.25 GM/50ML IV SOLN
2.2500 g | Freq: Three times a day (TID) | INTRAVENOUS | Status: DC
  Administered 2024-02-10 – 2024-02-14 (×13): 2.25 g via INTRAVENOUS
  Filled 2024-02-10 (×11): qty 50

## 2024-02-10 MED ORDER — OXYCODONE HCL 5 MG PO TABS
5.0000 mg | ORAL_TABLET | ORAL | Status: DC | PRN
  Administered 2024-02-11: 5 mg via ORAL
  Filled 2024-02-10: qty 1

## 2024-02-10 MED ORDER — ROCURONIUM BROMIDE 10 MG/ML (PF) SYRINGE
PREFILLED_SYRINGE | INTRAVENOUS | Status: AC
  Filled 2024-02-10: qty 10

## 2024-02-10 MED ORDER — KETOROLAC TROMETHAMINE 15 MG/ML IJ SOLN
INTRAMUSCULAR | Status: DC | PRN
  Administered 2024-02-10: 15 mg via INTRAVENOUS

## 2024-02-10 MED ORDER — SUCCINYLCHOLINE CHLORIDE 200 MG/10ML IV SOSY
PREFILLED_SYRINGE | INTRAVENOUS | Status: DC | PRN
  Administered 2024-02-10: 100 mg via INTRAVENOUS

## 2024-02-10 MED ORDER — ALBUMIN HUMAN 5 % IV SOLN
INTRAVENOUS | Status: AC
  Filled 2024-02-10: qty 500

## 2024-02-10 MED ORDER — ROCURONIUM BROMIDE 10 MG/ML (PF) SYRINGE
PREFILLED_SYRINGE | INTRAVENOUS | Status: DC | PRN
  Administered 2024-02-10: 40 mg via INTRAVENOUS

## 2024-02-10 MED ORDER — PANTOPRAZOLE SODIUM 40 MG IV SOLR: 40.0000 mg | INTRAVENOUS | Status: DC

## 2024-02-10 MED ORDER — BUPIVACAINE-EPINEPHRINE (PF) 0.25% -1:200000 IJ SOLN
INTRAMUSCULAR | Status: AC
  Filled 2024-02-10: qty 30

## 2024-02-10 MED ORDER — SODIUM CHLORIDE 0.9 % IR SOLN
Status: DC | PRN
  Administered 2024-02-10 (×2): 1000 mL

## 2024-02-10 SURGICAL SUPPLY — 1 items: NDL HYPO 22X1.5 SAFETY MO (MISCELLANEOUS) ×1 IMPLANT

## 2024-02-10 NOTE — Transfer of Care (Signed)
 Immediate Anesthesia Transfer of Care Note  Patient: Alec White  Procedure(s) Performed: CHOLECYSTECTOMY, ROBOT-ASSISTED, LAPAROSCOPIC (Abdomen)  Patient Location: PACU  Anesthesia Type:General  Level of Consciousness: awake and oriented  Airway & Oxygen Therapy: Patient Spontanous Breathing and Patient connected to face mask oxygen  Post-op Assessment: Report given to RN and Post -op Vital signs reviewed and stable  Post vital signs: Reviewed and stable  Last Vitals:  Vitals Value Taken Time  BP 142/65 02/10/24 14:51  Temp 36.4 C 02/10/24 14:51  Pulse 73 02/10/24 14:55  Resp 15 02/10/24 14:55  SpO2 100 % 02/10/24 14:55  Vitals shown include unfiled device data.  Last Pain:  Vitals:   02/10/24 1451  TempSrc:   PainSc: Asleep         Complications: There were no known notable events for this encounter.

## 2024-02-10 NOTE — Progress Notes (Signed)
 Patient brought back to room alert and oriented , lap sites intact, jp drain to right draining bright red blood. No complaint voiced by patient, family at bed side .

## 2024-02-10 NOTE — Plan of Care (Signed)
" °  Problem: Education: Goal: Ability to describe self-care measures that may prevent or decrease complications (Diabetes Survival Skills Education) will improve Outcome: Progressing   Problem: Coping: Goal: Ability to adjust to condition or change in health will improve Outcome: Progressing   Problem: Fluid Volume: Goal: Ability to maintain a balanced intake and output will improve Outcome: Progressing   Problem: Health Behavior/Discharge Planning: Goal: Ability to identify and utilize available resources and services will improve Outcome: Progressing   Problem: Metabolic: Goal: Ability to maintain appropriate glucose levels will improve Outcome: Progressing   Problem: Nutritional: Goal: Maintenance of adequate nutrition will improve Outcome: Progressing   Problem: Skin Integrity: Goal: Risk for impaired skin integrity will decrease Outcome: Progressing   Problem: Tissue Perfusion: Goal: Adequacy of tissue perfusion will improve Outcome: Progressing   Problem: Education: Goal: Knowledge of General Education information will improve Description: Including pain rating scale, medication(s)/side effects and non-pharmacologic comfort measures Outcome: Progressing   Problem: Health Behavior/Discharge Planning: Goal: Ability to manage health-related needs will improve Outcome: Progressing   Problem: Activity: Goal: Risk for activity intolerance will decrease Outcome: Progressing   Problem: Nutrition: Goal: Adequate nutrition will be maintained Outcome: Progressing   "

## 2024-02-10 NOTE — Plan of Care (Signed)

## 2024-02-10 NOTE — Anesthesia Postprocedure Evaluation (Signed)
"   Anesthesia Post Note  Patient: Alec White  Procedure(s) Performed: CHOLECYSTECTOMY, ROBOT-ASSISTED, LAPAROSCOPIC (Abdomen)  Patient location during evaluation: PACU Anesthesia Type: General Level of consciousness: awake and alert Pain management: pain level controlled Vital Signs Assessment: post-procedure vital signs reviewed and stable Respiratory status: spontaneous breathing, nonlabored ventilation and respiratory function stable Cardiovascular status: blood pressure returned to baseline and stable Postop Assessment: no apparent nausea or vomiting Anesthetic complications: no   There were no known notable events for this encounter.   Last Vitals:  Vitals:   02/10/24 1530 02/10/24 1609  BP: (!) 146/76 (!) 157/74  Pulse: 85 79  Resp: 14 18  Temp: (!) 36.2 C 36.5 C  SpO2: 97% 98%    Last Pain:  Vitals:   02/10/24 1530  TempSrc:   PainSc: Asleep                 Camellia Merilee Louder      "

## 2024-02-10 NOTE — Consult Note (Signed)
 Patient ID: Alec White, male   DOB: 20-May-1940, 83 y.o.   MRN: [id number]  HPI Alec White is a 83 y.o. male seen in consultation at the request of Dr Lawence. Presents to the emergency room yesterday morning with severe right upper quadrant pain cramping that is constant worsening with deep inspiration.  Partially alleviated with narcotics.  Pain radiates to the epigastric area. He denies any fevers any chills no evidence of biliary obstruction. He did have a CT scan of the abdomen pelvis as well as an ultrasound of the right upper quadrant showing evidence of distended gallbladder with gallstones consistent with early cholecystitis. Does have chronic kidney disease, coronary artery disease and diabetes Prior appendectomy in his 30s, retrocecal. He is a retired psychologist, occupational but is able to perform more than 4 METS of activity without shortness of breath or chest pain He was hypertensive in the ER but this has been improved since getting appropriate blood pressure meds. Son is at the bedside Retinae of 3.2 lipase 71 not normalized count is normal.  Current phosphatase is slightly elevated but this has been chronically high HPI  Past Medical History:  Diagnosis Date   Coronary artery disease    stents placed 20 years ago   Renal disorder    CKD stage 4    Past Surgical History:  Procedure Laterality Date   APPENDECTOMY     HIP PINNING,CANNULATED Left 11/26/2022   Procedure: PERCUTANEOUS FIXATION OF FEMORAL NECK;  Surgeon: Tobie Priest, MD;  Location: [facility] ORS;  Service: Orthopedics;  Laterality: Left;   spinal neurostimulator  08/31/2017    History reviewed. No pertinent family history.  Social History Social History[1]  Allergies[2]  Current Facility-Administered Medications  Medication Dose Route Frequency Provider Last Rate Last Admin   0.9 %  sodium chloride  infusion   Intravenous Continuous Ponnala, Shruthi, MD   Held at 02/10/24 2200   acetaminophen  (TYLENOL ) tablet 650 mg   650 mg Oral Q6H PRN Mansy, Jan A, MD       Or   acetaminophen  (TYLENOL ) suppository 650 mg  650 mg Rectal Q6H PRN Mansy, Jan A, MD       albumin  human 25 % solution 25 g  25 g Intravenous Once Kensy Blizard, [place] F, MD       allopurinol  (ZYLOPRIM ) tablet 100 mg  100 mg Oral Daily Mansy, Jan A, MD   100 mg at 02/10/24 0931   amLODipine  (NORVASC ) tablet 5 mg  5 mg Oral Daily Mansy, Jan A, MD   5 mg at 02/10/24 0931   atorvastatin  (LIPITOR) tablet 40 mg  40 mg Oral Daily Mansy, Jan A, MD   40 mg at 02/10/24 0930   Chlorhexidine  Gluconate Cloth 2 % PADS 6 each  6 each Topical Q0600 Jordis Laneta FALCON, MD   6 each at 02/10/24 9371   cholecalciferol  (VITAMIN D3) 25 MCG (1000 UNIT) tablet 5,000 Units  5,000 Units Oral Daily Mansy, Jan A, MD       empagliflozin  (JARDIANCE ) tablet 10 mg  10 mg Oral Daily Mansy, Jan A, MD       finasteride  (PROSCAR ) tablet 5 mg  5 mg Oral Daily Mansy, Jan A, MD   5 mg at 02/10/24 0931   hydrALAZINE  (APRESOLINE ) injection 10 mg  10 mg Intravenous Q6H PRN Mansy, Jan A, MD   10 mg at 02/10/24 0827   insulin  aspart (novoLOG ) injection 0-15 Units  0-15 Units Subcutaneous Q6H Mansy, Madison DELENA, MD   2  Units at 02/10/24 0936   labetalol  (NORMODYNE ) injection 20 mg  20 mg Intravenous Q3H PRN Mansy, Jan A, MD   20 mg at 02/10/24 0406   levothyroxine  (SYNTHROID ) tablet 200 mcg  200 mcg Oral Q0600 Mansy, Jan A, MD   200 mcg at 02/10/24 9373   magnesium  hydroxide (MILK OF MAGNESIA) suspension 30 mL  30 mL Oral Daily PRN Mansy, Jan A, MD       metoprolol  succinate (TOPROL -XL) 24 hr tablet 100 mg  100 mg Oral Daily Mansy, Jan A, MD   100 mg at 02/10/24 0930   morphine  (PF) 2 MG/ML injection 2 mg  2 mg Intravenous Q4H PRN Mansy, Jan A, MD   2 mg at 02/10/24 9166   ondansetron  (ZOFRAN ) tablet 4 mg  4 mg Oral Q6H PRN Mansy, Jan A, MD       Or   ondansetron  (ZOFRAN ) injection 4 mg  4 mg Intravenous Q6H PRN Mansy, Jan A, MD       polyethylene glycol (MIRALAX  / GLYCOLAX ) packet 17 g  17 g Oral Daily Mansy,  Jan A, MD       tamsulosin  (FLOMAX ) capsule 0.4 mg  0.4 mg Oral QPC supper Mansy, Jan A, MD       timolol  (TIMOPTIC ) 0.5 % ophthalmic solution 1 drop  1 drop Both Eyes Daily Mansy, Jan A, MD   1 drop at 02/10/24 0933   traZODone  (DESYREL ) tablet 25 mg  25 mg Oral QHS PRN Mansy, Jan A, MD   25 mg at 02/09/24 2321     Review of Systems Full ROS  was asked and was negative except for the information on the HPI  Physical Exam Blood pressure (!) 167/73, pulse 75, temperature 98 F (36.7 C), temperature source Oral, resp. rate 16, height 5' 7 (1.702 m), weight 82.1 kg, SpO2 97%. CONSTITUTIONAL: NAD. EYES: Pupils are equal, round, Sclera are non-icteric. EARS, NOSE, MOUTH AND THROAT: The oropharynx is clear. The oral mucosa is pink and moist. Hearing is intact to voice. LYMPH NODES:  Lymph nodes in the neck are normal. RESPIRATORY:  Lungs are clear. There is normal respiratory effort, with equal breath sounds bilaterally, and without pathologic use of accessory muscles. CARDIOVASCULAR: Heart is regular without murmurs, gallops, or rubs. GI: The abdomen is  soft. Tender to palpation RUQ w + Murphy sign. There are no palpable masses. There is no hepatosplenomegaly. There are normal bowel sounds  GU: Rectal deferred.   MUSCULOSKELETAL: Normal muscle strength and tone. No cyanosis or edema.   SKIN: Turgor is good and there are no pathologic skin lesions or ulcers. NEUROLOGIC: Motor and sensation is grossly normal. Cranial nerves are grossly intact. PSYCH:  Oriented to person, place and time. Affect is normal.  Data Reviewed I have personally reviewed the patient's imaging, laboratory findings and medical records.    Assessment/Plan 83 year old male with clinical findings of acute cholecystitis.  Discussed with the patient and his son in detail about his disease process.  I do recommend cholecystectomy.  I do think that he will be a good candidate for robotic approach The risks, benefits,  complications, treatment options, and expected outcomes were discussed with the patient. The possibilities of bleeding, recurrent infection, finding a normal gallbladder, perforation of viscus organs, damage to surrounding structures, bile leak, abscess formation, needing a drain placed, the need for additional procedures, reaction to medication, pulmonary aspiration,  failure to diagnose a condition, the possible need to convert to an open procedure,  and creating a complication requiring transfusion or operation were discussed with the patient. The patient and/or family concurred with the proposed plan, giving informed consent.  I personally spent a total of 75 minutes in the care of the patient today including performing a medically appropriate exam/evaluation, counseling and educating, placing orders, referring and communicating with other health care professionals, documenting clinical information in the EHR, independently interpreting and reviewing images studies and coordinating care.    Laneta Luna, MD FACS General Surgeon 02/10/2024, 10:59 AM      [1]  Social History Tobacco Use   Smoking status: Never   Smokeless tobacco: Never  Vaping Use   Vaping status: Never Used  Substance Use Topics   Alcohol use: Not Currently   Drug use: Never  [2]  Allergies Allergen Reactions   Tramadol  Other (See Comments)    Keeps him wide awake

## 2024-02-10 NOTE — Op Note (Signed)
 Robotic assisted laparoscopic Cholecystectomy  Pre-operative Diagnosis: cholecystitis  Post-operative Diagnosis: same  Procedure:  Robotic assisted laparoscopic Cholecystectomy  Surgeon: Laneta Luna, MD FACS  Anesthesia: Gen. with endotracheal tube  Findings: Severe gangrenous Cholecystitis with hydrops Due to severe inflammatory response unable to initial establish critical window of safety, I needed to to top down approach and incised the infundibulum to follow the origin of the cystic duct NO evidence of bile leak Distorted anatomy and severe inflammation made the case very difficult with additional and significant effort required ( by the provider) to perform safe surgery  Estimated Blood Loss: 50cc       Specimens: Gallbladder           Complications: none   Procedure Details  The patient was seen again in the Holding Room. The benefits, complications, treatment options, and expected outcomes were discussed with the patient. The risks of bleeding, infection, recurrence of symptoms, failure to resolve symptoms, bile duct damage, bile duct leak, retained common bile duct stone, bowel injury, any of which could require further surgery and/or ERCP, stent, or papillotomy were reviewed with the patient. The likelihood of improving the patient's symptoms with return to their baseline status is good.  The patient and/or family concurred with the proposed plan, giving informed consent.  The patient was taken to Operating Room, identified  and the procedure verified as Laparoscopic Cholecystectomy.  A Time Out was held and the above information confirmed.  Prior to the induction of general anesthesia, antibiotic prophylaxis was administered. VTE prophylaxis was in place. General endotracheal anesthesia was then administered and tolerated well. After the induction, the abdomen was prepped with Chloraprep and draped in the sterile fashion. The patient was positioned in the supine  position.  Cut down technique was used to enter the abdominal cavity and a Hasson trochar was placed after two vicryl stitches were anchored to the fascia. Pneumoperitoneum was then created with CO2 and tolerated well without any adverse changes in the patient's vital signs.  Three 8-mm ports were placed under direct vision. All skin incisions  were infiltrated with a local anesthetic agent before making the incision and placing the trocars.   The patient was positioned  in reverse Trendelenburg, robot was brought to the surgical field and docked in the standard fashion.  We made sure all the instrumentation was kept indirect view at all times and that there were no collision between the arms. I scrubbed out and went to the console.  The gallbladder was identified, it was distended and severely inflamed, was very difficult to manipulate therefore decompression with suction irrigator required. The fundus grasped and retracted cephalad. Adhesions were lysed bluntly. The infundibulum was grasped and retracted laterally, exposing the peritoneum overlying the triangle of Calot.  There was severe inflammation around infundibulum and cystic duct junction making usual approach more dangerous. I decided to do top down technique to allow better visualization of anatomical structures.  The gallbladder was taken from the gallbladder fossa in a retrograde fashion with the electrocautery.  I had to open anterior side of infundibulum to located cystic duct and to avoid injuries to the CBD due to the severe inflammation and distorted anatomy. I left a small piece of infundibulum due to the fact that the cystic duct was diving very deep and close to the CBD. Clips were used to divided the infundibulum. I was able to milk stone from the cystic duct.  Using ICG cholangiography we visualize the  CBD w/o obvious evidence of  bile injuries.  Hemostasis was achieved with the electrocautery. Inspection of the right upper  quadrant was performed. No bleeding, bile duct injury or leak, or bowel injury was noted. Robotic instruments and robotic arms were undocked in the standard fashion.  I scrubbed back in.  The gallbladder was removed and placed in an Endocatch bag. Irrigation of the RUQ performed with a liter of saline, 19 blake FR drain and secured to skin.  Pneumoperitoneum was released.  The periumbilical port site was closed with interrumpted 0 Vicryl sutures. 4-0 subcuticular Monocryl was used to close the skin. Dermabond was  applied.  The patient was then extubated and brought to the recovery room in stable condition. Sponge, lap, and needle counts were correct at closure and at the conclusion of the case.   Distorted anatomy and severe inflammation made the case very difficult with additional and significant effort required ( by the provider) to perform safe surgery             Laneta Luna, MD, FACS

## 2024-02-10 NOTE — Anesthesia Preprocedure Evaluation (Signed)
"                                    Anesthesia Evaluation  Patient identified by MRN, date of birth, ID band Patient awake    Reviewed: Allergy & Precautions, NPO status , Patient's Chart, lab work & pertinent test results  History of Anesthesia Complications Negative for: history of anesthetic complications  Airway Mallampati: III  TM Distance: <3 FB Neck ROM: full    Dental  (+) Chipped, Poor Dentition, Missing   Pulmonary neg pulmonary ROS, neg shortness of breath   Pulmonary exam normal        Cardiovascular hypertension, (-) angina + CAD and + Cardiac Stents  Normal cardiovascular exam     Neuro/Psych negative neurological ROS  negative psych ROS   GI/Hepatic negative GI ROS, Neg liver ROS,neg GERD  ,,  Endo/Other  diabetesHypothyroidism    Renal/GU Renal disease     Musculoskeletal   Abdominal   Peds  Hematology negative hematology ROS (+)   Anesthesia Other Findings Past Medical History: No date: Coronary artery disease     Comment:  stents placed 20 years ago No date: Renal disorder     Comment:  CKD stage 4  Past Surgical History: No date: APPENDECTOMY 11/26/2022: HIP PINNING,CANNULATED; Left     Comment:  Procedure: PERCUTANEOUS FIXATION OF FEMORAL NECK;                Surgeon: Tobie Priest, MD;  Location: [facility] ORS;  Service:              Orthopedics;  Laterality: Left; 08/31/2017: spinal neurostimulator  BMI    Body Mass Index: 28.35 kg/m      Reproductive/Obstetrics negative OB ROS                              Anesthesia Physical Anesthesia Plan  ASA: 3  Anesthesia Plan: General ETT   Post-op Pain Management:    Induction: Intravenous  PONV Risk Score and Plan: Ondansetron , Dexamethasone, Midazolam  and Treatment may vary due to age or medical condition  Airway Management Planned: Oral ETT  Additional Equipment:   Intra-op Plan:   Post-operative Plan: Extubation in OR  Informed  Consent: I have reviewed the patients History and Physical, chart, labs and discussed the procedure including the risks, benefits and alternatives for the proposed anesthesia with the patient or authorized representative who has indicated his/her understanding and acceptance.     Dental Advisory Given  Plan Discussed with: Anesthesiologist, CRNA and Surgeon  Anesthesia Plan Comments: (Patient consented for risks of anesthesia including but not limited to:  - adverse reactions to medications - damage to eyes, teeth, lips or other oral mucosa - nerve damage due to positioning  - sore throat or hoarseness - Damage to heart, brain, nerves, lungs, other parts of body or loss of life  Patient voiced understanding and assent.)        Anesthesia Quick Evaluation  "

## 2024-02-10 NOTE — Progress Notes (Signed)
 Patient taken off floor on bed to same day surgery for procedure.

## 2024-02-10 NOTE — Progress Notes (Signed)
 " PROGRESS NOTE    Alec White  FMW:[id number] DOB: 1940-12-03 DOA: 02/09/2024 PCP: Cleotilde Oneil FALCON, MD  Chief Complaint  Patient presents with   Abdominal Pain    Hospital Course:  Alec White is a 83 y.o. Caucasian male with medical history significant for coronary artery disease, stage 4 chronic kidney disease, essential hypertension, hypothyroidism, gout and dyslipidemia, type 2 diabetes mellitus presented to the ER due to acute onset of epigastric and RUQ pain.  On presentation was found to have elevated blood pressure 195/86, hypertensive urgency likely due to uncontrolled pain.  Workup revealed findings consistent with cholelithiasis with cholecystitis, seen by general surgery.  Hospital course as below  Subjective: Patient was examined at the bedside, new to me today. Continues to have hiccups, RUQ pain Wife and son present at the bedside, discussed plan of care and answered all questions   Objective: Vitals:   02/09/24 2357 02/10/24 0358 02/10/24 0436 02/10/24 0807  BP: (!) 168/80 (!) 184/81 (!) 158/84 (!) 167/73  Pulse: 82 89 83 75  Resp:    16  Temp:  98.2 F (36.8 C)  98 F (36.7 C)  TempSrc:  Oral  Oral  SpO2: 99% 100%  97%  Weight:      Height:        Intake/Output Summary (Last 24 hours) at 02/10/2024 0825 Last data filed at 02/10/2024 9352 Gross per 24 hour  Intake 50 ml  Output 325 ml  Net -275 ml   Filed Weights   02/09/24 1224  Weight: 82.1 kg    Examination: GENERAL:  83 y.o.-year-old Caucasian male patient lying in the bed with no acute distress.  NECK:  Supple, no jugular venous distention LUNGS: Normal breath sounds bilaterally, no wheezing, rales,rhonchi or crepitation CARDIOVASCULAR: Regular rate and rhythm, S1, S2 normal. No murmurs, rubs, or gallops.  ABDOMEN: Soft, nondistended, with epigastric and right upper quadrant abdominal tenderness without rebound tenderness guarding or rigidity.SABRA  Positive Murphy sign. Bowel sounds present. No  organomegaly or mass.  EXTREMITIES: No pedal edema, cyanosis, or clubbing.  NEUROLOGIC: AO x 3, no gross focal deficits  Assessment & Plan:  Symptomatic cholelithiasis Acute cholecystitis - LFTs normal - Surgery following, appreciate recs - Plan for laparoscopic cholecystectomy today 12/26 - On IV Zosyn  - Pain management, NPO, IV fluids   Hypertensive urgency HTN - Suspect likely due to pain - Resume amlodipine  5 mg, metoprolol  100 XL - Pain management   Chronic kidney disease stage IV - Reviewed care everywhere, baseline creatinine 3.5-3.6 - S/p IV fluids, creatinine 3.2 - Monitor Cr  Normocytic anemia due to CKD - Monitor Hb  Hx CAD - Elevated troponin likely secondary to hypertensive urgency, downtrended - Aspirin  daily   Type 2 diabetes mellitus with chronic kidney disease, without long-term current use of insulin  (HCC) - SSI, continue Jardiance .   BPH (benign prostatic hyperplasia) - continue Flomax  and Proscar .   Dyslipidemia Statin   Hypothyroidism continue Synthroid   Gout Continue allopurinol   Incidental findings Bilateral nonobstructive nephrolithiasis   DVT prophylaxis: SCD's   Code Status: Full Code Disposition:  Home tomorrow  Consultants:  Treatment Team:  Consulting Physician: Jordis Laneta FALCON, MD  Procedures:  Laparoscopic cholecystectomy 12/26  Antimicrobials:  Anti-infectives (From admission, onward)    Start     Dose/Rate Route Frequency Ordered Stop   02/09/24 1900  piperacillin -tazobactam (ZOSYN ) IVPB 3.375 g        3.375 g 100 mL/hr over 30 Minutes Intravenous  Once 02/09/24  1850 02/09/24 1931       Data Reviewed: I have personally reviewed following labs and imaging studies CBC: Recent Labs  Lab 02/09/24 1226  WBC 9.7  HGB 12.9*  HCT 38.0*  MCV 92.5  PLT 142*   Basic Metabolic Panel: Recent Labs  Lab 02/09/24 1226  NA 139  K 4.8  CL 106  CO2 23  GLUCOSE 133*  BUN 45*  CREATININE 3.28*  CALCIUM  9.9    GFR: Estimated Creatinine Clearance: 17.8 mL/min (A) (by C-G formula based on SCr of 3.28 mg/dL (H)). Liver Function Tests: Recent Labs  Lab 02/09/24 1226  AST 20  ALT 18  ALKPHOS 175*  BILITOT 0.5  PROT 6.5  ALBUMIN  4.0   CBG: Recent Labs  Lab 02/09/24 2146 02/10/24 0326  GLUCAP 121* 137*    No results found for this or any previous visit (from the past 240 hours).   Radiology Studies: US  Abdomen Limited RUQ (LIVER/GB) Result Date: 02/09/2024 CLINICAL DATA:  Upper abdominal pain. EXAM: ULTRASOUND ABDOMEN LIMITED RIGHT UPPER QUADRANT COMPARISON:  None Available. FINDINGS: Gallbladder: Multiple shadowing echogenic gallstones are seen within the gallbladder lumen. The largest measures approximately 1.3 cm. There is no evidence of gallbladder wall thickening (2.4 mm). No sonographic Murphy sign noted by sonographer. Common bile duct: Diameter: 2.1 mm Liver: The left lobe of the liver is poorly visualized secondary to overlying bowel gas. No focal lesion identified. Within normal limits in parenchymal echogenicity. Portal vein is patent on color Doppler imaging with normal direction of blood flow towards the liver. Other: None. IMPRESSION: Cholelithiasis without evidence of acute cholecystitis. Electronically Signed   By: Suzen Dials M.D.   On: 02/09/2024 16:42   CT ABDOMEN PELVIS WO CONTRAST Result Date: 02/09/2024 EXAM: CT ABDOMEN AND PELVIS WITHOUT CONTRAST 02/09/2024 03:27:50 PM TECHNIQUE: CT of the abdomen and pelvis was performed without the administration of intravenous contrast. Multiplanar reformatted images are provided for review. Automated exposure control, iterative reconstruction, and/or weight-based adjustment of the mA/kV was utilized to reduce the radiation dose to as low as reasonably achievable. COMPARISON: 07/25/2018 CLINICAL HISTORY: Abdominal pain, acute, nonlocalized. Bilateral cramping upper abdominal pain since this morning. FINDINGS: LOWER CHEST:  Bibasilar scarring. Mild cardiomegaly. Multivessel coronary artery calcification. LIVER: Subcentimeter central left hepatic lobe cysts. No intrahepatic biliary duct dilatation. GALLBLADDER AND BILE DUCTS: Gallstones up to 8 mm. Mild-to-moderate gallbladder distention. No biliary ductal dilatation. SPLEEN: Normal in size and morphology. PANCREAS: Normal, without duct dilatation or acute inflammation. ADRENAL GLANDS: Normal, without mass. KIDNEYS, URETERS AND BLADDER: Bilateral punctate renal collecting system calculi. Subcentimeter interpolar left renal angiomyolipoma. Bilateral low-density renal lesions are likely cysts at up to 2.6 cm. An interpolar right renal 1.8 cm lesion measures 21 HU, likely a minimally complex cyst. No follow up indicated. Mild bilateral renal cortical thinning. No hydronephrosis. No hydroureter or ureteric stone. No bladder calculi. Mild bladder wall irregularity. GI AND BOWEL: Normal stomach, without wall thickening. Normal small bowel caliber. Normal colon and terminal ileum. Appendix not visualized. PERITONEUM AND RETROPERITONEUM: No ascites. No free air. VASCULATURE: Aorta is normal in caliber. Aortic atherosclerosis. LYMPH NODES: No lymphadenopathy. REPRODUCTIVE ORGANS: Moderate prostatomegaly. BONES AND SOFT TISSUES: Left proximal femur fixation. Lumbosacral spine fixation. Dorsal spinal stimulator. Thoracolumbar spondylosis. Beam hardening artifact from lumbar spine hardware and battery for dorsal spinal stimulator. IMPRESSION: 1. Cholelithiasis with gallbladder distention, suspicious for acute cholecystitis. Consider right upper quadrant ultrasound. 2. Bilateral nonobstructive nephrolithiasis. 3. Prostatomegaly with mild bladder wall irregularity, suggesting a component of  outlet obstruction. 4. Incidental findings include aortic atherosclerosis (ICD10-I70.0) and coronary artery atherosclerosis. Electronically signed by: Rockey Kilts MD 02/09/2024 03:36 PM EST RP Workstation:  [id number]    Scheduled Meds:  allopurinol   100 mg Oral Daily   amLODipine   5 mg Oral Daily   atorvastatin   40 mg Oral Daily   Chlorhexidine  Gluconate Cloth  6 each Topical Q0600   cholecalciferol   5,000 Units Oral Daily   empagliflozin   10 mg Oral Daily   finasteride   5 mg Oral Daily   insulin  aspart  0-15 Units Subcutaneous Q6H   levothyroxine   200 mcg Oral Q0600   metoprolol  succinate  100 mg Oral Daily   polyethylene glycol  17 g Oral Daily   tamsulosin   0.4 mg Oral QPC supper   timolol   1 drop Both Eyes Daily   Continuous Infusions:  sodium chloride        LOS: 1 day  MDM: Patient is high risk for one or more organ failure.  They necessitate ongoing hospitalization for continued IV therapies and subsequent lab monitoring. Total time spent interpreting labs and vitals, reviewing the medical record, coordinating care amongst consultants and care team members, directly assessing and discussing care with the patient and/or family: 55 min Laree Lock, MD [facility]  To contact the attending physician between 7A-7P please use Epic Chat. To contact the covering physician during after hours 7P-7A, please review Amion.  02/10/2024, 8:25 AM   *This document has been created with the assistance of dictation software. Please excuse typographical errors. *   "

## 2024-02-11 DIAGNOSIS — K8020 Calculus of gallbladder without cholecystitis without obstruction: Secondary | ICD-10-CM | POA: Diagnosis not present

## 2024-02-11 LAB — COMPREHENSIVE METABOLIC PANEL WITH GFR
ALT: 77 U/L — ABNORMAL HIGH (ref 0–44)
AST: 77 U/L — ABNORMAL HIGH (ref 15–41)
Albumin: 3.4 g/dL — ABNORMAL LOW (ref 3.5–5.0)
Alkaline Phosphatase: 117 U/L (ref 38–126)
Anion gap: 10 (ref 5–15)
BUN: 46 mg/dL — ABNORMAL HIGH (ref 8–23)
CO2: 21 mmol/L — ABNORMAL LOW (ref 22–32)
Calcium: 8.9 mg/dL (ref 8.9–10.3)
Chloride: 109 mmol/L (ref 98–111)
Creatinine, Ser: 3.6 mg/dL — ABNORMAL HIGH (ref 0.61–1.24)
GFR, Estimated: 16 mL/min — ABNORMAL LOW
Glucose, Bld: 143 mg/dL — ABNORMAL HIGH (ref 70–99)
Potassium: 5.2 mmol/L — ABNORMAL HIGH (ref 3.5–5.1)
Sodium: 140 mmol/L (ref 135–145)
Total Bilirubin: 0.6 mg/dL (ref 0.0–1.2)
Total Protein: 5.5 g/dL — ABNORMAL LOW (ref 6.5–8.1)

## 2024-02-11 LAB — CBC
HCT: 32.8 % — ABNORMAL LOW (ref 39.0–52.0)
Hemoglobin: 10.8 g/dL — ABNORMAL LOW (ref 13.0–17.0)
MCH: 30.9 pg (ref 26.0–34.0)
MCHC: 32.9 g/dL (ref 30.0–36.0)
MCV: 93.7 fL (ref 80.0–100.0)
Platelets: 103 K/uL — ABNORMAL LOW (ref 150–400)
RBC: 3.5 MIL/uL — ABNORMAL LOW (ref 4.22–5.81)
RDW: 14.4 % (ref 11.5–15.5)
WBC: 14.9 K/uL — ABNORMAL HIGH (ref 4.0–10.5)
nRBC: 0 % (ref 0.0–0.2)

## 2024-02-11 LAB — GLUCOSE, CAPILLARY
Glucose-Capillary: 107 mg/dL — ABNORMAL HIGH (ref 70–99)
Glucose-Capillary: 139 mg/dL — ABNORMAL HIGH (ref 70–99)
Glucose-Capillary: 142 mg/dL — ABNORMAL HIGH (ref 70–99)
Glucose-Capillary: 151 mg/dL — ABNORMAL HIGH (ref 70–99)
Glucose-Capillary: 86 mg/dL (ref 70–99)

## 2024-02-11 MED ORDER — BOOST / RESOURCE BREEZE PO LIQD CUSTOM
1.0000 | Freq: Three times a day (TID) | ORAL | Status: DC
  Administered 2024-02-11 (×2): 1 via ORAL

## 2024-02-11 MED ORDER — SODIUM ZIRCONIUM CYCLOSILICATE 10 G PO PACK
10.0000 g | PACK | Freq: Once | ORAL | Status: AC
  Administered 2024-02-11: 10 g via ORAL
  Filled 2024-02-11: qty 1

## 2024-02-11 MED ORDER — AMLODIPINE BESYLATE 5 MG PO TABS
5.0000 mg | ORAL_TABLET | Freq: Every day | ORAL | Status: DC
  Administered 2024-02-12 – 2024-02-14 (×3): 5 mg via ORAL
  Filled 2024-02-11 (×2): qty 1

## 2024-02-11 MED ORDER — ASPIRIN 81 MG PO TBEC
81.0000 mg | DELAYED_RELEASE_TABLET | Freq: Every day | ORAL | Status: DC
  Administered 2024-02-12 – 2024-02-14 (×3): 81 mg via ORAL
  Filled 2024-02-11: qty 1

## 2024-02-11 NOTE — Progress Notes (Signed)
 Mobility Specialist - Progress Note  Pre-mobility: , SpO2-97%  During mobility: SpO2-98%  Post-mobility:  SPO2-98%     02/11/24 1500  Mobility  Activity Ambulated with assistance;Stood at bedside;Dangled on edge of bed  Level of Assistance Standby assist, set-up cues, supervision of patient - no hands on  Assistive Device Other (Comment) (IV Stand)  Distance Ambulated (ft) 60 ft  Range of Motion/Exercises Active  Activity Response Tolerated well  Mobility visit 1 Mobility  Mobility Specialist Start Time (ACUTE ONLY) 1443  Mobility Specialist Stop Time (ACUTE ONLY) 1511  Mobility Specialist Time Calculation (min) (ACUTE ONLY) 28 min   Pt was supine in bed with the HOB elevated on RA and guest in the room upon entry. Pt agreed to mobility. Pt is able today to get to the EOB independently with bed features and time. Pt is able today to STS independently with no AD. Pt was receiving IV Fluids. Pt was able to ambulate with the IV stand. Pt did state that he has some upper back pain but it wasn't present at time of activity. Pt did need VC for upright positioning. After activity pt returned to the bed with needs in reach.  Clem Rodes Mobility Specialist 02/11/2024, 3:31 PM

## 2024-02-11 NOTE — Progress Notes (Signed)
 CC: s/p robo chole Subjective: Doing well, no pain  Drain 125 serosanguinous Cre 3.6  Objective: Vital signs in last 24 hours: Temp:  [97.2 F (36.2 C)-98.1 F (36.7 C)] 98.1 F (36.7 C) (12/27 0731) Pulse Rate:  [64-92] 64 (12/27 0731) Resp:  [14-19] 16 (12/27 0731) BP: (129-157)/(62-76) 129/62 (12/27 0731) SpO2:  [97 %-100 %] 98 % (12/27 0731) Last BM Date :  (none since admission)  Intake/Output from previous day: 12/26 0701 - 12/27 0700 In: 1730 [P.O.:480; I.V.:900; IV Piggyback:350] Out: 575 [Urine:450; Drains:125] Intake/Output this shift: Total I/O In: -  Out: 25 [Drains:25]  Physical exam:  NAD alert Abd: soft, minimal incisional tenderness, no peritonitis, JP drain w mainly serous drainage  Lab Results: CBC  Recent Labs    02/10/24 0811 02/11/24 0535  WBC 10.0 14.9*  HGB 12.4* 10.8*  HCT 36.3* 32.8*  PLT 120* 103*   BMET Recent Labs    02/10/24 0811 02/11/24 0535  NA 137 140  K 4.9 5.2*  CL 106 109  CO2 21* 21*  GLUCOSE 128* 143*  BUN 42* 46*  CREATININE 3.21* 3.60*  CALCIUM  9.6 8.9   PT/INR No results for input(s): LABPROT, INR in the last 72 hours. ABG No results for input(s): PHART, HCO3 in the last 72 hours.  Invalid input(s): PCO2, PO2  Studies/Results: US  Abdomen Limited RUQ (LIVER/GB) Result Date: 02/09/2024 CLINICAL DATA:  Upper abdominal pain. EXAM: ULTRASOUND ABDOMEN LIMITED RIGHT UPPER QUADRANT COMPARISON:  None Available. FINDINGS: Gallbladder: Multiple shadowing echogenic gallstones are seen within the gallbladder lumen. The largest measures approximately 1.3 cm. There is no evidence of gallbladder wall thickening (2.4 mm). No sonographic Murphy sign noted by sonographer. Common bile duct: Diameter: 2.1 mm Liver: The left lobe of the liver is poorly visualized secondary to overlying bowel gas. No focal lesion identified. Within normal limits in parenchymal echogenicity. Portal vein is patent on color Doppler imaging  with normal direction of blood flow towards the liver. Other: None. IMPRESSION: Cholelithiasis without evidence of acute cholecystitis. Electronically Signed   By: Suzen Dials M.D.   On: 02/09/2024 16:42   CT ABDOMEN PELVIS WO CONTRAST Result Date: 02/09/2024 EXAM: CT ABDOMEN AND PELVIS WITHOUT CONTRAST 02/09/2024 03:27:50 PM TECHNIQUE: CT of the abdomen and pelvis was performed without the administration of intravenous contrast. Multiplanar reformatted images are provided for review. Automated exposure control, iterative reconstruction, and/or weight-based adjustment of the mA/kV was utilized to reduce the radiation dose to as low as reasonably achievable. COMPARISON: 07/25/2018 CLINICAL HISTORY: Abdominal pain, acute, nonlocalized. Bilateral cramping upper abdominal pain since this morning. FINDINGS: LOWER CHEST: Bibasilar scarring. Mild cardiomegaly. Multivessel coronary artery calcification. LIVER: Subcentimeter central left hepatic lobe cysts. No intrahepatic biliary duct dilatation. GALLBLADDER AND BILE DUCTS: Gallstones up to 8 mm. Mild-to-moderate gallbladder distention. No biliary ductal dilatation. SPLEEN: Normal in size and morphology. PANCREAS: Normal, without duct dilatation or acute inflammation. ADRENAL GLANDS: Normal, without mass. KIDNEYS, URETERS AND BLADDER: Bilateral punctate renal collecting system calculi. Subcentimeter interpolar left renal angiomyolipoma. Bilateral low-density renal lesions are likely cysts at up to 2.6 cm. An interpolar right renal 1.8 cm lesion measures 21 HU, likely a minimally complex cyst. No follow up indicated. Mild bilateral renal cortical thinning. No hydronephrosis. No hydroureter or ureteric stone. No bladder calculi. Mild bladder wall irregularity. GI AND BOWEL: Normal stomach, without wall thickening. Normal small bowel caliber. Normal colon and terminal ileum. Appendix not visualized. PERITONEUM AND RETROPERITONEUM: No ascites. No free air.  VASCULATURE: Aorta is normal  in caliber. Aortic atherosclerosis. LYMPH NODES: No lymphadenopathy. REPRODUCTIVE ORGANS: Moderate prostatomegaly. BONES AND SOFT TISSUES: Left proximal femur fixation. Lumbosacral spine fixation. Dorsal spinal stimulator. Thoracolumbar spondylosis. Beam hardening artifact from lumbar spine hardware and battery for dorsal spinal stimulator. IMPRESSION: 1. Cholelithiasis with gallbladder distention, suspicious for acute cholecystitis. Consider right upper quadrant ultrasound. 2. Bilateral nonobstructive nephrolithiasis. 3. Prostatomegaly with mild bladder wall irregularity, suggesting a component of outlet obstruction. 4. Incidental findings include aortic atherosclerosis (ICD10-I70.0) and coronary artery atherosclerosis. Electronically signed by: Rockey Kilts MD 02/09/2024 03:36 PM EST RP Workstation: [id number]    Anti-infectives: Anti-infectives (From admission, onward)    Start     Dose/Rate Route Frequency Ordered Stop   02/10/24 1200  piperacillin -tazobactam (ZOSYN ) IVPB 3.375 g  Status:  Discontinued        3.375 g 100 mL/hr over 30 Minutes Intravenous  Once 02/10/24 1107 02/10/24 1116   02/10/24 1200  piperacillin -tazobactam (ZOSYN ) IVPB 2.25 g        2.25 g 100 mL/hr over 30 Minutes Intravenous Every 8 hours 02/10/24 1116     02/09/24 1900  piperacillin -tazobactam (ZOSYN ) IVPB 3.375 g        3.375 g 100 mL/hr over 30 Minutes Intravenous  Once 02/09/24 1850 02/09/24 1931       Assessment/Plan: Doing well after cholecystectomy Continue drain and antibiotics No surgical complications Nephrology contacted to help manage meds due to CKD We will see him as outpt We will be available    Laneta Luna, MD, FACS  02/11/2024

## 2024-02-11 NOTE — Discharge Instructions (Addendum)
 Surgical [facility] Care Surgical drains are placed during surgery. They are used to get rid of the extra fluid that can build up in a wound after surgery. They can also help heal the wound. The kinds of drains include: Active drains. These use suction to pull drainage away from the wound. Drainage flows through a tube to a container outside of the body. With these drains, you need to keep the bulb or the container flat (compressed) at all times, except while being emptied. Flattening the bulb or container creates suction. One of the most common types of active drains is the Jackson-Pratt (JP) drain. Passive drains. These allow fluid to drain using gravity rather than suction. Drainage flows through a tube to a bandage (dressing) or a container outside of the body. These drains do not need to be emptied. One of the most common types of passive drains is the Penrose drain. Right after surgery, drainage is often bright red and a little thicker than water. It may turn yellow or pink over time and become thinner. Your health care provider may remove the drain when the drainage stops or when the amount decreases to 1-2 tbsp (15-30 mL) in a 24-hour period. How to care for your surgical drain Care for your drain as told by your provider. Keep the skin around the drain dry and covered with a dressing at all times. This can help to prevent infection. If the drain is placed at your back, or in any other hard-to-reach area, ask someone to help you change the dressing, empty the drain, and check for infection. Changing the dressing Follow instructions from your provider about how to change your dressing. Change it at least once a day. Change it more often if needed to keep the dressing dry. Make sure you: Gather your supplies. These may include: Tape. Germ-free cleaning solution (sterile saline). Cotton swabs. Split gauze drain sponge: 4 x 4 inches (10 x 10 cm). Gauze square: 4 x 4 inches (10 x 10 cm). Wash your  hands with soap and water for at least 20 seconds before and after you change your dressing. If soap and water are not available, use hand sanitizer. Remove the old dressing. Avoid using scissors to do that. Wash your hands with soap and water again after taking off the old dressing. Use sterile saline to clean the skin around the drain. You may need to use a cotton swab to clean the skin. Place the tube through the slit in a drain sponge. Place the drain sponge so that it covers your wound. Place the gauze square or another drain sponge on top of the drain sponge that is on the wound. Make sure the tube is between those layers. Tape the dressing to your skin. Then, tape the drainage tube to your skin 1-2 inches (2.5-5 cm) below the place where the tube enters your body. Taping keeps the tube from pulling on any stitches (sutures) that you have. Wash your hands with soap and water. Write down the color of your drainage and how often you change your dressing. Emptying the active drain  Make sure you have a measuring cup that you can empty your drainage into. Wash your hands with soap and water for at least 20 seconds before and after you empty your drain. If soap and water are not available, use hand sanitizer. Loosen any pins or clips that hold the tube in place. If your provider tells you to strip the tube to prevent clots and  tube blockages: Hold the tube at the skin with one hand. Use your other hand to pinch the tube with your thumb and first finger. Gently move your fingers down the tube while squeezing very lightly. This clears any drainage, clots, or tissue from the tube. You may need to do this a few times a day to keep the tube clear. Do not pull on the tube. Open the bulb cap or the drain plug. Do not touch the inside of the cap or the bottom of the plug. Turn the device upside down and gently squeeze. Empty all of the drainage into the measuring cup. Compress the bulb or the container.  Replace the cap or the plug. To compress the bulb or the container, squeeze it firmly in the middle while you close the cap or plug the container. Write down the amount of drainage that you have in each 24-hour period. If you have less than 2 tbsp (30 mL) of drainage during the 24 hours, contact your provider. Flush the drainage down the toilet. Wash your hands with soap and water. Checking for infection Check your drain area every day for signs of infection. Check for: Redness, swelling, or pain. Warmth. Pus or a bad smell. Drainage that looks cloudy. Tenderness or pressure at the spot where the drain leaves your body. Contact a health care provider if: You have any signs of infection around your drain area. You have a fever or chills. The amount of drainage that you have stops all of a sudden, or the drainage increases rather than decreases. Your tube falls out. Your active drain does not stay compressed after you empty it. The tube gets detached from the bulb or container. This information is not intended to replace advice given to you by your health care provider. Make sure you discuss any questions you have with your health care provider. Document Revised: 09/18/2021 Document Reviewed: 09/18/2021 Elsevier Patient Education  2024 [employer]vier Inc. Laparoscopic Cholecystectomy, Care After   These instructions give you information on caring for yourself after your procedure. Your doctor may also give you more specific instructions. Call your doctor if you have any problems or questions after your procedure.  HOME CARE  Change your bandages (dressings) as told by your doctor.  Keep the wound dry and clean. Wash the wound gently with soap and water. Pat the wound dry with a clean towel.  Do not take baths, swim, or use hot tubs for 2 weeks, or as told by your doctor.  Only take medicine as told by your doctor.  Eat a normal diet as told by your doctor.  Do not lift anything heavier than 10  pounds (4.5 kg) until your doctor says it is okay.  Do not play contact sports for 1 week, or as told by your doctor. GET HELP IF:  Your wound is red, puffy (swollen), or painful.  You have yellowish-white fluid (pus) coming from the wound.  You have fluid draining from the wound for more than 1 day.  You have a bad smell coming from the wound.  Your wound breaks open. GET HELP RIGHT AWAY IF:  You have trouble breathing.  You have chest pain.  You have a fever >101  You have pain in the shoulders (shoulder strap areas) that is getting worse.  You feel dizzy or pass out (faint).  You have severe belly (abdominal) pain.  You feel sick to your stomach (nauseous) or throw up (vomit) for more than 1 day.  Do you feel isolated?  [facility] offers a [employer] that anyone can call toll free at [phone]. The friendship line is available 24 hours a day  [facility] is a Program of All-inclusive Care for the Elderly (PACE). Their mission is to promote and sustain the independence of seniors wishing to remain in the community. They provide seniors with comprehensive long-term health, social, medical and dietary care. Their program is a safe alternative to nursing home care. [phone]  [facility] Physical Address [facility] [address] [place], [place] [address] Phone: [phone]. . Online zoom yoga class, connect with others without leaving your home [facility] offers [place] dance cardio sessions for individuals via Zoom. This program provides: - Dance fitness activities Please contact program for more information. Servinganyone in need adults 18+ hiv/aids individuals families Call [phone]  Email [email or web address] to get more info  [employer] offers an online [employer] to individuals where they can receive help to focus on their best health. Whether you're a [employer] member or not, the  neighborhood center offers a... Main Serviceshealth education  exercise & fitness  community support services  recreation  virtual support Other Servicessupport groups Servinganyone in need adults young adults teens seniors individuals families [email or web address] to get more info  Schedule on their website  [facility] offers an array of activities for adults age 37 and over. This program provides:- Fitness and health programs- Tech classes- Activity books Main Serviceshealth education  community support services  exercise & fitness  recreation  more education Servingseniors  Call [phone]    For more resources go online to [email or web address] and type in you zipcode

## 2024-02-11 NOTE — Plan of Care (Signed)

## 2024-02-11 NOTE — Progress Notes (Signed)
 " PROGRESS NOTE    Alec White  FMW:[id number] DOB: 03-Mar-1940 DOA: 02/09/2024 PCP: Cleotilde Oneil FALCON, MD  Chief Complaint  Patient presents with   Abdominal Pain    Hospital Course:  [name] is a 83 y.o. Caucasian male with medical history significant for coronary artery disease, stage 4 chronic kidney disease, essential hypertension, hypothyroidism, gout and dyslipidemia, type 2 diabetes mellitus presented to the ER due to acute onset of epigastric and RUQ pain.  On presentation was found to have elevated blood pressure 195/86, hypertensive urgency likely due to uncontrolled pain.  Workup revealed findings consistent with cholelithiasis with cholecystitis, seen by general surgery.  Hospital course as below  Subjective: Patient was examined at the bedside, spouse and son also present at the bedside Reports feeling much better today, abdominal pain has nearly resolved Feeling weak, will continue to encourage OOB with nursing Reports will be able to walk with nursing, if needed will consult PT   Objective: Vitals:   02/10/24 2037 02/11/24 0416 02/11/24 0731 02/11/24 1321  BP: (!) 155/76 138/62 129/62 (!) 112/55  Pulse: 92 66 64   Resp: 19 18 16    Temp: 97.8 F (36.6 C) 97.8 F (36.6 C) 98.1 F (36.7 C)   TempSrc:  Oral Oral   SpO2: 100% 99% 98%   Weight:      Height:        Intake/Output Summary (Last 24 hours) at 02/11/2024 1418 Last data filed at 02/11/2024 9089 Gross per 24 hour  Intake 1680 ml  Output 600 ml  Net 1080 ml   Filed Weights   02/09/24 1224  Weight: 82.1 kg    Examination: GENERAL:  83 y.o.-year-old Caucasian male patient lying in the bed with no acute distress.  NECK:  Supple, no jugular venous distention LUNGS: Normal breath sounds bilaterally, no wheezing, rales,rhonchi or crepitation CARDIOVASCULAR: Regular rate and rhythm, S1, S2 normal. No murmurs, rubs, or gallops.  ABDOMEN: Soft, nondistended, tenderness consistent postop, JP drain with  serosanguineous output EXTREMITIES: No pedal edema, cyanosis, or clubbing.  NEUROLOGIC: AO x 3, no gross focal deficits  Assessment & Plan:  Symptomatic cholelithiasis Acute gangrenous cholecystitis - Leukocytosis post op - Surgery following, appreciate recs - S/P laparoscopic cholecystectomy 12/26 - On IV Zosyn , will discharge on p.o. Augmentin to complete 10 days - Pain management   Hypertensive urgency HTN - Suspect likely due to pain - Resume amlodipine  5 mg, metoprolol  100 XL - Pain management   Chronic kidney disease stage IV - Reviewed care everywhere, baseline creatinine 3.5-3.6 - S/p IV fluids, creatinine 3.6.  Encourage adequate p.o. intake - Nephrology consulted - Monitor Cr  Mild hyperkalemia Lokelma  1 dose 10 g  Normocytic anemia due to CKD - Mild drop in hemoglobin post op and s/p IV fluids - Monitor Hb  Hx CAD - Elevated troponin likely secondary to hypertensive urgency, downtrended - Aspirin  daily   Type 2 diabetes mellitus with chronic kidney disease, without long-term current use of insulin  (HCC) - SSI, continue Jardiance    BPH (benign prostatic hyperplasia) - continue Flomax  and Proscar .   Dyslipidemia Statin   Hypothyroidism continue Synthroid   Gout Continue allopurinol   Incidental findings Bilateral nonobstructive nephrolithiasis   DVT prophylaxis: SCD's   Code Status: Full Code Disposition:  Home tomorrow  Consultants:    Procedures:  Laparoscopic cholecystectomy 12/26  Antimicrobials:  Anti-infectives (From admission, onward)    Start     Dose/Rate Route Frequency Ordered Stop   02/10/24 1200  piperacillin -tazobactam (ZOSYN ) IVPB 3.375 g  Status:  Discontinued        3.375 g 100 mL/hr over 30 Minutes Intravenous  Once 02/10/24 1107 02/10/24 1116   02/10/24 1200  piperacillin -tazobactam (ZOSYN ) IVPB 2.25 g        2.25 g 100 mL/hr over 30 Minutes Intravenous Every 8 hours 02/10/24 1116     02/09/24 1900   piperacillin -tazobactam (ZOSYN ) IVPB 3.375 g        3.375 g 100 mL/hr over 30 Minutes Intravenous  Once 02/09/24 1850 02/09/24 1931       Data Reviewed: I have personally reviewed following labs and imaging studies CBC: Recent Labs  Lab 02/09/24 1226 02/10/24 0811 02/11/24 0535  WBC 9.7 10.0 14.9*  HGB 12.9* 12.4* 10.8*  HCT 38.0* 36.3* 32.8*  MCV 92.5 91.7 93.7  PLT 142* 120* 103*   Basic Metabolic Panel: Recent Labs  Lab 02/09/24 1226 02/10/24 0811 02/11/24 0535  NA 139 137 140  K 4.8 4.9 5.2*  CL 106 106 109  CO2 23 21* 21*  GLUCOSE 133* 128* 143*  BUN 45* 42* 46*  CREATININE 3.28* 3.21* 3.60*  CALCIUM  9.9 9.6 8.9   GFR: Estimated Creatinine Clearance: 16.2 mL/min (A) (by C-G formula based on SCr of 3.6 mg/dL (H)). Liver Function Tests: Recent Labs  Lab 02/09/24 1226 02/10/24 0811 02/11/24 0535  AST 20 18 77*  ALT 18 15 77*  ALKPHOS 175* 160* 117  BILITOT 0.5 0.7 0.6  PROT 6.5 6.1* 5.5*  ALBUMIN  4.0 3.7 3.4*   CBG: Recent Labs  Lab 02/10/24 2033 02/10/24 2354 02/11/24 0334 02/11/24 0617 02/11/24 1141  GLUCAP 113* 116* 151* 139* 142*    No results found for this or any previous visit (from the past 240 hours).   Radiology Studies: US  Abdomen Limited RUQ (LIVER/GB) Result Date: 02/09/2024 CLINICAL DATA:  Upper abdominal pain. EXAM: ULTRASOUND ABDOMEN LIMITED RIGHT UPPER QUADRANT COMPARISON:  None Available. FINDINGS: Gallbladder: Multiple shadowing echogenic gallstones are seen within the gallbladder lumen. The largest measures approximately 1.3 cm. There is no evidence of gallbladder wall thickening (2.4 mm). No sonographic Murphy sign noted by sonographer. Common bile duct: Diameter: 2.1 mm Liver: The left lobe of the liver is poorly visualized secondary to overlying bowel gas. No focal lesion identified. Within normal limits in parenchymal echogenicity. Portal vein is patent on color Doppler imaging with normal direction of blood flow towards the  liver. Other: None. IMPRESSION: Cholelithiasis without evidence of acute cholecystitis. Electronically Signed   By: Suzen Dials M.D.   On: 02/09/2024 16:42   CT ABDOMEN PELVIS WO CONTRAST Result Date: 02/09/2024 EXAM: CT ABDOMEN AND PELVIS WITHOUT CONTRAST 02/09/2024 03:27:50 PM TECHNIQUE: CT of the abdomen and pelvis was performed without the administration of intravenous contrast. Multiplanar reformatted images are provided for review. Automated exposure control, iterative reconstruction, and/or weight-based adjustment of the mA/kV was utilized to reduce the radiation dose to as low as reasonably achievable. COMPARISON: 07/25/2018 CLINICAL HISTORY: Abdominal pain, acute, nonlocalized. Bilateral cramping upper abdominal pain since this morning. FINDINGS: LOWER CHEST: Bibasilar scarring. Mild cardiomegaly. Multivessel coronary artery calcification. LIVER: Subcentimeter central left hepatic lobe cysts. No intrahepatic biliary duct dilatation. GALLBLADDER AND BILE DUCTS: Gallstones up to 8 mm. Mild-to-moderate gallbladder distention. No biliary ductal dilatation. SPLEEN: Normal in size and morphology. PANCREAS: Normal, without duct dilatation or acute inflammation. ADRENAL GLANDS: Normal, without mass. KIDNEYS, URETERS AND BLADDER: Bilateral punctate renal collecting system calculi. Subcentimeter interpolar left renal angiomyolipoma. Bilateral low-density renal lesions  are likely cysts at up to 2.6 cm. An interpolar right renal 1.8 cm lesion measures 21 HU, likely a minimally complex cyst. No follow up indicated. Mild bilateral renal cortical thinning. No hydronephrosis. No hydroureter or ureteric stone. No bladder calculi. Mild bladder wall irregularity. GI AND BOWEL: Normal stomach, without wall thickening. Normal small bowel caliber. Normal colon and terminal ileum. Appendix not visualized. PERITONEUM AND RETROPERITONEUM: No ascites. No free air. VASCULATURE: Aorta is normal in caliber. Aortic  atherosclerosis. LYMPH NODES: No lymphadenopathy. REPRODUCTIVE ORGANS: Moderate prostatomegaly. BONES AND SOFT TISSUES: Left proximal femur fixation. Lumbosacral spine fixation. Dorsal spinal stimulator. Thoracolumbar spondylosis. Beam hardening artifact from lumbar spine hardware and battery for dorsal spinal stimulator. IMPRESSION: 1. Cholelithiasis with gallbladder distention, suspicious for acute cholecystitis. Consider right upper quadrant ultrasound. 2. Bilateral nonobstructive nephrolithiasis. 3. Prostatomegaly with mild bladder wall irregularity, suggesting a component of outlet obstruction. 4. Incidental findings include aortic atherosclerosis (ICD10-I70.0) and coronary artery atherosclerosis. Electronically signed by: Rockey Kilts MD 02/09/2024 03:36 PM EST RP Workstation: [id number]    Scheduled Meds:  acetaminophen   1,000 mg Oral Q6H   allopurinol   100 mg Oral Daily   amLODipine   5 mg Oral Daily   atorvastatin   40 mg Oral Daily   cholecalciferol   5,000 Units Oral Daily   empagliflozin   10 mg Oral Daily   feeding supplement  1 Container Oral TID BM   finasteride   5 mg Oral Daily   insulin  aspart  0-15 Units Subcutaneous Q6H   levothyroxine   200 mcg Oral Q0600   metoprolol  succinate  100 mg Oral Daily   pantoprazole  (PROTONIX ) IV  40 mg Intravenous Q12H   polyethylene glycol  17 g Oral Daily   tamsulosin   0.4 mg Oral QPC supper   timolol   1 drop Both Eyes Daily   Continuous Infusions:  albumin  human     piperacillin -tazobactam (ZOSYN )  IV 2.25 g (02/11/24 1149)     LOS: 2 days  MDM: Patient is high risk for one or more organ failure.  They necessitate ongoing hospitalization for continued IV therapies and subsequent lab monitoring. Total time spent interpreting labs and vitals, reviewing the medical record, coordinating care amongst consultants and care team members, directly assessing and discussing care with the patient and/or family: 55 min Laree Lock, MD [facility]  To contact the attending physician between 7A-7P please use Epic Chat. To contact the covering physician during after hours 7P-7A, please review Amion.  02/11/2024, 2:18 PM   *This document has been created with the assistance of dictation software. Please excuse typographical errors. *   "

## 2024-02-12 DIAGNOSIS — K8020 Calculus of gallbladder without cholecystitis without obstruction: Secondary | ICD-10-CM | POA: Diagnosis not present

## 2024-02-12 LAB — CBC
HCT: 30.5 % — ABNORMAL LOW (ref 39.0–52.0)
Hemoglobin: 10.1 g/dL — ABNORMAL LOW (ref 13.0–17.0)
MCH: 30.9 pg (ref 26.0–34.0)
MCHC: 33.1 g/dL (ref 30.0–36.0)
MCV: 93.3 fL (ref 80.0–100.0)
Platelets: 106 K/uL — ABNORMAL LOW (ref 150–400)
RBC: 3.27 MIL/uL — ABNORMAL LOW (ref 4.22–5.81)
RDW: 14.1 % (ref 11.5–15.5)
WBC: 10.7 K/uL — ABNORMAL HIGH (ref 4.0–10.5)
nRBC: 0 % (ref 0.0–0.2)

## 2024-02-12 LAB — COMPREHENSIVE METABOLIC PANEL WITH GFR
ALT: 67 U/L — ABNORMAL HIGH (ref 0–44)
AST: 50 U/L — ABNORMAL HIGH (ref 15–41)
Albumin: 3.1 g/dL — ABNORMAL LOW (ref 3.5–5.0)
Alkaline Phosphatase: 100 U/L (ref 38–126)
Anion gap: 12 (ref 5–15)
BUN: 61 mg/dL — ABNORMAL HIGH (ref 8–23)
CO2: 20 mmol/L — ABNORMAL LOW (ref 22–32)
Calcium: 8.7 mg/dL — ABNORMAL LOW (ref 8.9–10.3)
Chloride: 103 mmol/L (ref 98–111)
Creatinine, Ser: 4.63 mg/dL — ABNORMAL HIGH (ref 0.61–1.24)
GFR, Estimated: 12 mL/min — ABNORMAL LOW
Glucose, Bld: 83 mg/dL (ref 70–99)
Potassium: 4.4 mmol/L (ref 3.5–5.1)
Sodium: 135 mmol/L (ref 135–145)
Total Bilirubin: 0.4 mg/dL (ref 0.0–1.2)
Total Protein: 5.5 g/dL — ABNORMAL LOW (ref 6.5–8.1)

## 2024-02-12 LAB — GLUCOSE, CAPILLARY
Glucose-Capillary: 104 mg/dL — ABNORMAL HIGH (ref 70–99)
Glucose-Capillary: 83 mg/dL (ref 70–99)
Glucose-Capillary: 87 mg/dL (ref 70–99)
Glucose-Capillary: 88 mg/dL (ref 70–99)

## 2024-02-12 MED ORDER — LACTATED RINGERS IV SOLN: INTRAVENOUS | Status: AC

## 2024-02-12 NOTE — Progress Notes (Signed)
 " PROGRESS NOTE    Alec White  FMW:[id number] DOB: 1940/04/17 DOA: 02/09/2024 PCP: Cleotilde Oneil FALCON, MD  Chief Complaint  Patient presents with   Abdominal Pain    Hospital Course:  Alec White is a 83 y.o. Caucasian male with medical history significant for coronary artery disease, stage 4 chronic kidney disease, essential hypertension, hypothyroidism, gout and dyslipidemia, type 2 diabetes mellitus presented to the ER due to acute onset of epigastric and RUQ pain.  On presentation was found to have elevated blood pressure 195/86, hypertensive urgency likely due to uncontrolled pain.  Workup revealed findings consistent with cholelithiasis with cholecystitis, seen by general surgery.  Hospital course as below  Subjective: Patient was examined at the bedside, spouse present Reports pain significantly controlled, denies any complaints today Discussed uptrending creatinine, encouraged adequate p.o. intake On IV fluids, anticipate discharge tomorrow if kidney function better   Objective: Vitals:   02/11/24 1942 02/12/24 0416 02/12/24 0828 02/12/24 0933  BP: 129/60 (!) 149/71 (!) 149/62 (!) 149/62  Pulse: 86 73 63 63  Resp: 18 18 19    Temp: 97.8 F (36.6 C) 97.7 F (36.5 C) 98 F (36.7 C)   TempSrc: Oral  Oral   SpO2: 97% 98% 98%   Weight:      Height:        Intake/Output Summary (Last 24 hours) at 02/12/2024 1636 Last data filed at 02/12/2024 1200 Gross per 24 hour  Intake --  Output 485 ml  Net -485 ml   Filed Weights   02/09/24 1224  Weight: 82.1 kg    Examination: GENERAL:  83 y.o.-year-old Caucasian male patient lying in the bed with no acute distress.  NECK:  Supple, no jugular venous distention LUNGS: Normal breath sounds bilaterally, no wheezing, rales,rhonchi or crepitation CARDIOVASCULAR: Regular rate and rhythm, S1, S2 normal. No murmurs, rubs, or gallops.  ABDOMEN: Soft, nondistended, tenderness consistent postop, JP drain with serosanguineous  output EXTREMITIES: No pedal edema, cyanosis, or clubbing.  NEUROLOGIC: AO x 3, no gross focal deficits  Assessment & Plan:  Symptomatic cholelithiasis Acute gangrenous cholecystitis - Leukocytosis improving, LFTs improving - Surgery following, appreciate recs - S/P laparoscopic cholecystectomy 12/26 - On IV Zosyn , will discharge on p.o. Augmentin to complete 10 days - Pain management   Hypertensive urgency HTN - Suspect likely due to pain - amlodipine  5 mg, metoprolol  100 XL - Pain management   AKI on chronic kidney disease stage IV - Reviewed care everywhere, baseline creatinine 3.5-3.6, creatinine bumped to 4.63 - Start IV fluids, Encourage adequate p.o. intake - Jardiance  on hold - Nephrology following, appreciate recs - Monitor Cr  Mild hyperkalemia Resolved s/p Lokelma   Normocytic anemia due to CKD - Mild drop in hemoglobin post op and s/p IV fluids - Monitor Hb  Hx CAD - Elevated troponin likely secondary to hypertensive urgency, downtrended - Aspirin  daily   Type 2 diabetes mellitus with chronic kidney disease, without long-term current use of insulin  (HCC) - SSI, continue Jardiance    BPH (benign prostatic hyperplasia) - continue Flomax  and Proscar .   Dyslipidemia Statin   Hypothyroidism continue Synthroid   Gout Continue allopurinol   Incidental findings Bilateral nonobstructive nephrolithiasis   DVT prophylaxis: SCD's   Code Status: Full Code Disposition:  Home tomorrow  Consultants:  General surgery Nephrology   Procedures:  Laparoscopic cholecystectomy 12/26  Antimicrobials:  Anti-infectives (From admission, onward)    Start     Dose/Rate Route Frequency Ordered Stop   02/10/24 1200  piperacillin -tazobactam (ZOSYN ) IVPB  3.375 g  Status:  Discontinued        3.375 g 100 mL/hr over 30 Minutes Intravenous  Once 02/10/24 1107 02/10/24 1116   02/10/24 1200  piperacillin -tazobactam (ZOSYN ) IVPB 2.25 g        2.25 g 100 mL/hr over 30  Minutes Intravenous Every 8 hours 02/10/24 1116     02/09/24 1900  piperacillin -tazobactam (ZOSYN ) IVPB 3.375 g        3.375 g 100 mL/hr over 30 Minutes Intravenous  Once 02/09/24 1850 02/09/24 1931       Data Reviewed: I have personally reviewed following labs and imaging studies CBC: Recent Labs  Lab 02/09/24 1226 02/10/24 0811 02/11/24 0535 02/12/24 0511  WBC 9.7 10.0 14.9* 10.7*  HGB 12.9* 12.4* 10.8* 10.1*  HCT 38.0* 36.3* 32.8* 30.5*  MCV 92.5 91.7 93.7 93.3  PLT 142* 120* 103* 106*   Basic Metabolic Panel: Recent Labs  Lab 02/09/24 1226 02/10/24 0811 02/11/24 0535 02/12/24 0511  NA 139 137 140 135  K 4.8 4.9 5.2* 4.4  CL 106 106 109 103  CO2 23 21* 21* 20*  GLUCOSE 133* 128* 143* 83  BUN 45* 42* 46* 61*  CREATININE 3.28* 3.21* 3.60* 4.63*  CALCIUM  9.9 9.6 8.9 8.7*   GFR: Estimated Creatinine Clearance: 12.6 mL/min (A) (by C-G formula based on SCr of 4.63 mg/dL (H)). Liver Function Tests: Recent Labs  Lab 02/09/24 1226 02/10/24 0811 02/11/24 0535 02/12/24 0511  AST 20 18 77* 50*  ALT 18 15 77* 67*  ALKPHOS 175* 160* 117 100  BILITOT 0.5 0.7 0.6 0.4  PROT 6.5 6.1* 5.5* 5.5*  ALBUMIN  4.0 3.7 3.4* 3.1*   CBG: Recent Labs  Lab 02/11/24 1141 02/11/24 1749 02/11/24 2330 02/12/24 0535 02/12/24 1211  GLUCAP 142* 86 107* 83 88    No results found for this or any previous visit (from the past 240 hours).   Radiology Studies: No results found.   Scheduled Meds:  acetaminophen   1,000 mg Oral Q6H   allopurinol   100 mg Oral Daily   amLODipine   5 mg Oral Daily   aspirin  EC  81 mg Oral Daily   atorvastatin   40 mg Oral Daily   feeding supplement  1 Container Oral TID BM   finasteride   5 mg Oral Daily   insulin  aspart  0-15 Units Subcutaneous Q6H   levothyroxine   200 mcg Oral Q0600   metoprolol  succinate  100 mg Oral Daily   pantoprazole  (PROTONIX ) IV  40 mg Intravenous Q12H   polyethylene glycol  17 g Oral Daily   tamsulosin   0.4 mg Oral QPC  supper   timolol   1 drop Both Eyes Daily   Continuous Infusions:  albumin  human     lactated ringers  50 mL/hr at 02/12/24 1130   piperacillin -tazobactam (ZOSYN )  IV 2.25 g (02/12/24 1304)     LOS: 3 days  MDM: Patient is high risk for one or more organ failure.  They necessitate ongoing hospitalization for continued IV therapies and subsequent lab monitoring. Total time spent interpreting labs and vitals, reviewing the medical record, coordinating care amongst consultants and care team members, directly assessing and discussing care with the patient and/or family: 55 min Laree Lock, MD [facility]  To contact the attending physician between 7A-7P please use Epic Chat. To contact the covering physician during after hours 7P-7A, please review Amion.  02/12/2024, 4:36 PM   *This document has been created with the assistance of dictation software. Please excuse typographical  errors. *   "

## 2024-02-12 NOTE — Progress Notes (Signed)
 Mobility Specialist - Progress Note   Pre-mobility:  SpO2-94%  During mobility: , SpO2-97%  Post-mobility: , SPO2-98%   02/12/24 1000  Mobility  Activity Ambulated with assistance;Stood at bedside;Dangled on edge of bed  Level of Assistance Standby assist, set-up cues, supervision of patient - no hands on  Assistive Device Other (Comment) (IV STand)  Distance Ambulated (ft) 60 ft  Range of Motion/Exercises Active  Activity Response Tolerated well  Mobility visit 1 Mobility  Mobility Specialist Start Time (ACUTE ONLY) 0933  Mobility Specialist Stop Time (ACUTE ONLY) 0959  Mobility Specialist Time Calculation (min) (ACUTE ONLY) 26 min    Pt was supine in bed with the HOB elevated and guest in the room upon entry. Pt agreed to mobility. Pt is able today to get to the EOB independently with bed features. Pt is able today to STS  independently. Pt was receiving IV fluids at time of mobility. Pt ambulated well with IV stand within the room. Pt didn't need recovery break throughout activity. After activity pt returned to the bed with needs in reach and guest in the room upon exit.  Clem Rodes Mobility Specialist 02/12/2024, 10:41 AM

## 2024-02-12 NOTE — Plan of Care (Signed)
" °  Problem: Coping: Goal: Ability to adjust to condition or change in health will improve Outcome: Progressing   Problem: Fluid Volume: Goal: Ability to maintain a balanced intake and output will improve Outcome: Progressing   Problem: Health Behavior/Discharge Planning: Goal: Ability to manage health-related needs will improve Outcome: Progressing   Problem: Metabolic: Goal: Ability to maintain appropriate glucose levels will improve Outcome: Progressing   Problem: Skin Integrity: Goal: Risk for impaired skin integrity will decrease Outcome: Progressing   Problem: Health Behavior/Discharge Planning: Goal: Ability to manage health-related needs will improve Outcome: Progressing   "

## 2024-02-12 NOTE — Plan of Care (Signed)

## 2024-02-12 NOTE — Progress Notes (Signed)
 " [facility]  ROUNDING NOTE   Subjective:  Patient is s/p cholecystectomy with JP drain. Son at bedside. Patient presents in good spirits. Patient acknowledges plan to monitor kidney function and to start amlodipine  to improve blood pressure. No concerns presented.    Objective:  Vital signs in last 24 hours:  Temp:  [97.7 F (36.5 C)-98 F (36.7 C)] 98 F (36.7 C) (12/28 0828) Pulse Rate:  [63-86] 63 (12/28 0933) Resp:  [16-19] 19 (12/28 0828) BP: (108-149)/(55-71) 149/62 (12/28 0933) SpO2:  [96 %-98 %] 98 % (12/28 0828)  Weight change:  Filed Weights   02/09/24 1224  Weight: 82.1 kg    Intake/Output: I/O last 3 completed shifts: In: 50 [IV Piggyback:50] Out: 805 [Urine:700; Drains:105]   Intake/Output this shift:  No intake/output data recorded.  Physical Exam: General: NAD  Head: Normocephalic  Eyes: Anicteric  Neck: Supple  Lungs:  Clear to auscultation  Heart: Regular rate   Abdomen:  Soft, JP drain in place  Extremities:  No peripheral edema.  Neurologic: Alert, awake, conversant  Skin: Warm, dry.  Access: No    Basic Metabolic Panel: Recent Labs  Lab 02/09/24 1226 02/10/24 0811 02/11/24 0535 02/12/24 0511  NA 139 137 140 135  K 4.8 4.9 5.2* 4.4  CL 106 106 109 103  CO2 23 21* 21* 20*  GLUCOSE 133* 128* 143* 83  BUN 45* 42* 46* 61*  CREATININE 3.28* 3.21* 3.60* 4.63*  CALCIUM  9.9 9.6 8.9 8.7*    Liver Function Tests: Recent Labs  Lab 02/09/24 1226 02/10/24 0811 02/11/24 0535 02/12/24 0511  AST 20 18 77* 50*  ALT 18 15 77* 67*  ALKPHOS 175* 160* 117 100  BILITOT 0.5 0.7 0.6 0.4  PROT 6.5 6.1* 5.5* 5.5*  ALBUMIN  4.0 3.7 3.4* 3.1*   Recent Labs  Lab 02/09/24 1226 02/10/24 0811  LIPASE 71* 40   No results for input(s): AMMONIA in the last 168 hours.  CBC: Recent Labs  Lab 02/09/24 1226 02/10/24 0811 02/11/24 0535 02/12/24 0511  WBC 9.7 10.0 14.9* 10.7*  HGB 12.9* 12.4* 10.8* 10.1*  HCT 38.0* 36.3* 32.8*  30.5*  MCV 92.5 91.7 93.7 93.3  PLT 142* 120* 103* 106*    Cardiac Enzymes: No results for input(s): CKTOTAL, CKMB, CKMBINDEX, TROPONINI in the last 168 hours.  BNP: Invalid input(s): POCBNP  CBG: Recent Labs  Lab 02/11/24 0617 02/11/24 1141 02/11/24 1749 02/11/24 2330 02/12/24 0535  GLUCAP 139* 142* 86 107* 83    Microbiology: Results for orders placed or performed during the hospital encounter of 11/25/22  Surgical pcr screen     Status: None   Collection Time: 11/26/22  4:30 AM   Specimen: Nasal Mucosa; Nasal Swab  Result Value Ref Range Status   MRSA, PCR NEGATIVE NEGATIVE Final   Staphylococcus aureus NEGATIVE NEGATIVE Final    Comment: (NOTE) The Xpert SA Assay (FDA approved for NASAL specimens in patients 20 years of age and older), is one component of a comprehensive surveillance program. It is not intended to diagnose infection nor to guide or monitor treatment. Performed at [facility], [address] [place], [place] [address]     Coagulation Studies: No results for input(s): LABPROT, INR in the last 72 hours.  Urinalysis: Recent Labs    02/09/24 1515  COLORURINE STRAW*  LABSPEC 1.010  PHURINE 6.0  GLUCOSEU >=500*  HGBUR SMALL*  BILIRUBINUR NEGATIVE  KETONESUR NEGATIVE  PROTEINUR >=300*  NITRITE NEGATIVE  LEUKOCYTESUR NEGATIVE  Imaging: No results found.   Medications:    albumin  human     lactated ringers  75 mL/hr at 02/12/24 0950   piperacillin -tazobactam (ZOSYN )  IV 2.25 g (02/12/24 0443)    acetaminophen   1,000 mg Oral Q6H   allopurinol   100 mg Oral Daily   amLODipine   5 mg Oral Daily   aspirin  EC  81 mg Oral Daily   atorvastatin   40 mg Oral Daily   cholecalciferol   5,000 Units Oral Daily   feeding supplement  1 Container Oral TID BM   finasteride   5 mg Oral Daily   insulin  aspart  0-15 Units Subcutaneous Q6H   levothyroxine   200 mcg Oral Q0600   metoprolol  succinate  100 mg Oral Daily    pantoprazole  (PROTONIX ) IV  40 mg Intravenous Q12H   polyethylene glycol  17 g Oral Daily   tamsulosin   0.4 mg Oral QPC supper   timolol   1 drop Both Eyes Daily   hydrALAZINE , magnesium  hydroxide, morphine  injection, ondansetron  **OR** ondansetron  (ZOFRAN ) IV, oxyCODONE , traZODone   Assessment/ Plan:  [name] is a 83 y.o.  male PMH of chronic kidney disease stage IV, hypertension, diabetes, nephrolithiasis, BPH, and chronic back pain with multiple surgeries admitted for cholecystitis and is now s/p cholecystectomy. He is know to our practice and is followed by Dr. Dominica.  Chronic Kidney Disease Stage IV with the most recent GFR <20 . 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 Baseline creatinine 3.86 in October. No acute indication for dialysis at this time. Avoid renal toxins. Monitor renal indices. Continue IV hydration, on LR 52ml/hr   Lab Results  Component Value Date   CREATININE 4.63 (H) 02/12/2024   CREATININE 3.60 (H) 02/11/2024   CREATININE 3.21 (H) 02/10/2024    Intake/Output Summary (Last 24 hours) at 02/12/2024 1353 Last data filed at 02/12/2024 1200 Gross per 24 hour  Intake --  Output 485 ml  Net -485 ml    Hypertension   Home medications include metoprolol  and furosemide. Diuretics on hold. Currently on metoprolol  and hydralazine . Will start amlodipine  daily. BP 149/62 today  Type 2 diabetes mellitus  Most recent A1C 5.5 on 02/10/24 On sliding scale while inpatient, managed by primary team.  Anemia of chronic kidney disease  Hgb 10.1 stable.   Secondary Hyperparathyroidism  PTH  47 12/28/2022   LOS: 3 Ajene Carchi P Isais Klipfel 12/28/202510:59 AM  "

## 2024-02-13 DIAGNOSIS — K8020 Calculus of gallbladder without cholecystitis without obstruction: Secondary | ICD-10-CM | POA: Diagnosis not present

## 2024-02-13 LAB — RENAL FUNCTION PANEL
Albumin: 3.2 g/dL — ABNORMAL LOW (ref 3.5–5.0)
Anion gap: 11 (ref 5–15)
BUN: 63 mg/dL — ABNORMAL HIGH (ref 8–23)
CO2: 22 mmol/L (ref 22–32)
Calcium: 8.9 mg/dL (ref 8.9–10.3)
Chloride: 104 mmol/L (ref 98–111)
Creatinine, Ser: 4.64 mg/dL — ABNORMAL HIGH (ref 0.61–1.24)
GFR, Estimated: 12 mL/min — ABNORMAL LOW
Glucose, Bld: 92 mg/dL (ref 70–99)
Phosphorus: 3.9 mg/dL (ref 2.5–4.6)
Potassium: 4.1 mmol/L (ref 3.5–5.1)
Sodium: 137 mmol/L (ref 135–145)

## 2024-02-13 LAB — PHOSPHORUS: Phosphorus: 3.9 mg/dL (ref 2.5–4.6)

## 2024-02-13 LAB — GLUCOSE, CAPILLARY
Glucose-Capillary: 113 mg/dL — ABNORMAL HIGH (ref 70–99)
Glucose-Capillary: 96 mg/dL (ref 70–99)
Glucose-Capillary: 126 mg/dL — ABNORMAL HIGH (ref 70–99)
Glucose-Capillary: 48 mg/dL — ABNORMAL LOW (ref 70–99)
Glucose-Capillary: 132 mg/dL — ABNORMAL HIGH (ref 70–99)
Glucose-Capillary: 64 mg/dL — ABNORMAL LOW (ref 70–99)
Glucose-Capillary: 88 mg/dL (ref 70–99)

## 2024-02-13 LAB — CBC
HCT: 32.9 % — ABNORMAL LOW (ref 39.0–52.0)
Hemoglobin: 11 g/dL — ABNORMAL LOW (ref 13.0–17.0)
MCH: 31.1 pg (ref 26.0–34.0)
MCHC: 33.4 g/dL (ref 30.0–36.0)
MCV: 92.9 fL (ref 80.0–100.0)
Platelets: 125 K/uL — ABNORMAL LOW (ref 150–400)
RBC: 3.54 MIL/uL — ABNORMAL LOW (ref 4.22–5.81)
RDW: 13.7 % (ref 11.5–15.5)
WBC: 8.2 K/uL (ref 4.0–10.5)
nRBC: 0 % (ref 0.0–0.2)

## 2024-02-13 MED ORDER — INSULIN ASPART 100 UNIT/ML IJ SOLN
0.0000 [IU] | Freq: Three times a day (TID) | INTRAMUSCULAR | Status: DC
  Administered 2024-02-14: 1 [IU] via SUBCUTANEOUS
  Filled 2024-02-13: qty 1

## 2024-02-13 MED ORDER — DEXTROSE 50 % IV SOLN: 25.0000 g | Freq: Once | INTRAVENOUS | Status: DC

## 2024-02-13 MED ORDER — ENSURE PLUS HIGH PROTEIN PO LIQD
237.0000 mL | Freq: Two times a day (BID) | ORAL | Status: DC
  Administered 2024-02-13 – 2024-02-14 (×2): 237 mL via ORAL

## 2024-02-13 NOTE — Care Management Important Message (Signed)
 Important Message  Patient Details  Name: Alec White MRN: [id number] Date of Birth: [birth year]   Important Message Given:  Yes - Medicare IM     Rakeem Colley W, CMA 02/13/2024, 1:01 PM

## 2024-02-13 NOTE — Progress Notes (Signed)
 " PROGRESS NOTE    Alec White  FMW:[id number] DOB: Oct 02, 1940 DOA: 02/09/2024 PCP: Cleotilde Oneil FALCON, MD  Chief Complaint  Patient presents with   Abdominal Pain    Hospital Course:  Alec White is a 83 y.o. Caucasian male with medical history significant for coronary artery disease, stage 4 chronic kidney disease, essential hypertension, hypothyroidism, gout and dyslipidemia, type 2 diabetes mellitus presented to the ER due to acute onset of epigastric and RUQ pain.  On presentation was found to have elevated blood pressure 195/86, hypertensive urgency likely due to uncontrolled pain.  Workup revealed findings consistent with cholelithiasis with cholecystitis, seen by general surgery.  Hospital course as below  Subjective: Patient was examined at the bedside, spouse present Reports pain significantly controlled, denies any complaints today Discussed elevated creatinine, s/p IV fluids, encourage p.o. intake Discussed with nephrology, like to continue to monitor creatinine Dissipate discharge tomorrow if creatinine improves   Objective: Vitals:   02/13/24 1317 02/13/24 1404 02/13/24 1433 02/13/24 1519  BP: (!) 172/69 (!) 175/73 (!) 179/73 (!) 157/71  Pulse:   71   Resp:      Temp:      TempSrc:      SpO2:   97%   Weight:      Height:        Intake/Output Summary (Last 24 hours) at 02/13/2024 1536 Last data filed at 02/13/2024 1300 Gross per 24 hour  Intake 1075.76 ml  Output 1295 ml  Net -219.24 ml   Filed Weights   02/09/24 1224  Weight: 82.1 kg    Examination: GENERAL:  83 y.o.-year-old Caucasian male patient lying in the bed with no acute distress.  NECK:  Supple, no jugular venous distention LUNGS: Normal breath sounds bilaterally, no wheezing, rales,rhonchi or crepitation CARDIOVASCULAR: Regular rate and rhythm, S1, S2 normal. No murmurs, rubs, or gallops.  ABDOMEN: Soft, nondistended, tenderness consistent postop, JP drain with serosanguineous  output EXTREMITIES: No pedal edema, cyanosis, or clubbing.  NEUROLOGIC: AO x 3, no gross focal deficits  Assessment & Plan:  Symptomatic cholelithiasis Acute gangrenous cholecystitis - Leukocytosis resolved, LFTs improving - Surgery following, appreciate recs - S/P laparoscopic cholecystectomy 12/26 - On IV Zosyn , will discharge on p.o. Augmentin to complete 10 days - Pain management   Hypertensive urgency HTN - Suspect likely due to pain - amlodipine  5 mg, metoprolol  100 XL - Pain management   AKI on chronic kidney disease stage IV - Reviewed care everywhere, baseline creatinine 3.5-3.6, creatinine bumped to 4.63 - s/p IV fluids, Encourage adequate p.o. intake - Jardiance  on hold - Nephrology following, appreciate recs - Monitor Cr  Mild hyperkalemia Resolved s/p Lokelma   Normocytic anemia due to CKD - Mild drop in hemoglobin post op and s/p IV fluids - Monitor Hb  Hx CAD - Elevated troponin likely secondary to hypertensive urgency, downtrended - Aspirin  daily   Type 2 diabetes mellitus with chronic kidney disease, without long-term current use of insulin  (HCC) - SSI, continue Jardiance    BPH (benign prostatic hyperplasia) - continue Flomax  and Proscar .   Dyslipidemia Statin   Hypothyroidism continue Synthroid   Gout Continue allopurinol   Incidental findings Bilateral nonobstructive nephrolithiasis  Outpatient physical therapy at discharge  DVT prophylaxis: SCD's   Code Status: Full Code Disposition:  Home tomorrow  Consultants:  General surgery Nephrology   Procedures:  Laparoscopic cholecystectomy 12/26  Antimicrobials:  Anti-infectives (From admission, onward)    Start     Dose/Rate Route Frequency Ordered Stop   02/10/24  1200  piperacillin -tazobactam (ZOSYN ) IVPB 3.375 g  Status:  Discontinued        3.375 g 100 mL/hr over 30 Minutes Intravenous  Once 02/10/24 1107 02/10/24 1116   02/10/24 1200  piperacillin -tazobactam (ZOSYN ) IVPB 2.25  g        2.25 g 100 mL/hr over 30 Minutes Intravenous Every 8 hours 02/10/24 1116     02/09/24 1900  piperacillin -tazobactam (ZOSYN ) IVPB 3.375 g        3.375 g 100 mL/hr over 30 Minutes Intravenous  Once 02/09/24 1850 02/09/24 1931       Data Reviewed: I have personally reviewed following labs and imaging studies CBC: Recent Labs  Lab 02/09/24 1226 02/10/24 0811 02/11/24 0535 02/12/24 0511 02/13/24 0931  WBC 9.7 10.0 14.9* 10.7* 8.2  HGB 12.9* 12.4* 10.8* 10.1* 11.0*  HCT 38.0* 36.3* 32.8* 30.5* 32.9*  MCV 92.5 91.7 93.7 93.3 92.9  PLT 142* 120* 103* 106* 125*   Basic Metabolic Panel: Recent Labs  Lab 02/09/24 1226 02/10/24 0811 02/11/24 0535 02/12/24 0511 02/13/24 0931  NA 139 137 140 135 137  K 4.8 4.9 5.2* 4.4 4.1  CL 106 106 109 103 104  CO2 23 21* 21* 20* 22  GLUCOSE 133* 128* 143* 83 92  BUN 45* 42* 46* 61* 63*  CREATININE 3.28* 3.21* 3.60* 4.63* 4.64*  CALCIUM  9.9 9.6 8.9 8.7* 8.9  PHOS  --   --   --   --  3.9   GFR: Estimated Creatinine Clearance: 12.6 mL/min (A) (by C-G formula based on SCr of 4.64 mg/dL (H)). Liver Function Tests: Recent Labs  Lab 02/09/24 1226 02/10/24 0811 02/11/24 0535 02/12/24 0511 02/13/24 0931  AST 20 18 77* 50*  --   ALT 18 15 77* 67*  --   ALKPHOS 175* 160* 117 100  --   BILITOT 0.5 0.7 0.6 0.4  --   PROT 6.5 6.1* 5.5* 5.5*  --   ALBUMIN  4.0 3.7 3.4* 3.1* 3.2*   CBG: Recent Labs  Lab 02/12/24 1733 02/12/24 2345 02/13/24 0518 02/13/24 0759 02/13/24 1147  GLUCAP 87 104* 113* 96 126*    No results found for this or any previous visit (from the past 240 hours).   Radiology Studies: No results found.   Scheduled Meds:  acetaminophen   1,000 mg Oral Q6H   allopurinol   100 mg Oral Daily   amLODipine   5 mg Oral Daily   aspirin  EC  81 mg Oral Daily   atorvastatin   40 mg Oral Daily   dextrose   25 g Intravenous Once   feeding supplement  237 mL Oral BID BM   finasteride   5 mg Oral Daily   insulin  aspart  0-9  Units Subcutaneous TID WC   levothyroxine   200 mcg Oral Q0600   metoprolol  succinate  100 mg Oral Daily   pantoprazole  (PROTONIX ) IV  40 mg Intravenous Q12H   polyethylene glycol  17 g Oral Daily   tamsulosin   0.4 mg Oral QPC supper   timolol   1 drop Both Eyes Daily   Continuous Infusions:  piperacillin -tazobactam (ZOSYN )  IV 2.25 g (02/13/24 1151)     LOS: 4 days  MDM: Patient is high risk for one or more organ failure.  They necessitate ongoing hospitalization for continued IV therapies and subsequent lab monitoring. Total time spent interpreting labs and vitals, reviewing the medical record, coordinating care amongst consultants and care team members, directly assessing and discussing care with the patient and/or family: 55 min  Laree Lock, MD [facility]  To contact the attending physician between 7A-7P please use Epic Chat. To contact the covering physician during after hours 7P-7A, please review Amion.  02/13/2024, 3:36 PM   *This document has been created with the assistance of dictation software. Please excuse typographical errors. *   "

## 2024-02-13 NOTE — Plan of Care (Signed)
" °  Problem: Education: Goal: Ability to describe self-care measures that may prevent or decrease complications (Diabetes Survival Skills Education) will improve Outcome: Progressing   Problem: Coping: Goal: Ability to adjust to condition or change in health will improve Outcome: Progressing   Problem: Nutritional: Goal: Maintenance of adequate nutrition will improve Outcome: Progressing   Problem: Skin Integrity: Goal: Risk for impaired skin integrity will decrease Outcome: Progressing   Problem: Activity: Goal: Risk for activity intolerance will decrease Outcome: Progressing   Problem: Nutrition: Goal: Adequate nutrition will be maintained Outcome: Progressing   Problem: Elimination: Goal: Will not experience complications related to bowel motility Outcome: Progressing   Problem: Pain Managment: Goal: General experience of comfort will improve and/or be controlled Outcome: Progressing   Problem: Safety: Goal: Ability to remain free from injury will improve Outcome: Progressing   "

## 2024-02-13 NOTE — TOC CM/SW Note (Signed)
" ° ° ° °  [facility] REFERRAL        Occupational Therapy * Physical Therapy * Speech Therapy                           DATE 02/13/2024  PATIENT NAME Alec White PATIENT MRN [id number]       DIAGNOSIS/DIAGNOSIS CODE K80.20, E03.9, I16.0, E78.5, N40.0, E11.22, N17.9, N18.9, K80.00, K80.01  DATE OF DISCHARGE: Potentially 02/14/2024       PRIMARY CARE PHYSICIAN  Oneil Pinal, MD  PCP [facility] [phone]     Dear Provider (Name: [facility]  Fax: [phone]   I certify that I have examined this patient and that occupational/physical/speech therapy is necessary on an outpatient basis.    The patient has expressed interest in completing their recommended course of therapy at your  location.  Once a formal order from the patient's primary care physician has been obtained, please  contact him/her to schedule an appointment for evaluation at your earliest convenience.   [ x]  Physical Therapy Evaluate and Treat  [  ]  Occupational Therapy Evaluate and Treat  [  ]  Speech Therapy Evaluate and Treat         The patient's primary care physician (listed above) must furnish and be responsible for a formal order such that the recommended services may be furnished while under the primary physician's care, and that the plan of care will be established and reviewed every 30 days (or more often if condition necessitates).   "

## 2024-02-13 NOTE — Progress Notes (Signed)
 " [facility]  ROUNDING NOTE   Subjective:  Patient is s/p cholecystectomy with JP drain.   Update Patient seen sitting up in chair Alec White at bedside Poor oral intake, would prefer meals from home Remains on room air No lower extremity edema  Creatinine 4.64   Objective:  Vital signs in last 24 hours:  Temp:  [97.7 F (36.5 C)-98.3 F (36.8 C)] 97.9 F (36.6 C) (12/29 0740) Pulse Rate:  [66-71] 71 (12/29 1433) Resp:  [16-19] 16 (12/29 0740) BP: (137-179)/(60-75) 157/71 (12/29 1519) SpO2:  [95 %-100 %] 97 % (12/29 1433)  Weight change:  Filed Weights   02/09/24 1224  Weight: 82.1 kg    Intake/Output: I/O last 3 completed shifts: In: 655.8 [I.V.:655.8] Out: 1230 [Urine:1100; Drains:85; Other:45]   Intake/Output this shift:  Total I/O In: 420 [P.O.:420] Out: 550 [Urine:550]  Physical Exam: General: NAD  Head: Normocephalic  Eyes: Anicteric  Lungs:  Clear to auscultation  Heart: Regular rate   Abdomen:  Soft, JP drain in place  Extremities:  No peripheral edema.  Neurologic: Alert, awake, conversant  Skin: Warm, dry.  Access: None    Basic Metabolic Panel: Recent Labs  Lab 02/09/24 1226 02/10/24 0811 02/11/24 0535 02/12/24 0511 02/13/24 0931  NA 139 137 140 135 137  K 4.8 4.9 5.2* 4.4 4.1  CL 106 106 109 103 104  CO2 23 21* 21* 20* 22  GLUCOSE 133* 128* 143* 83 92  BUN 45* 42* 46* 61* 63*  CREATININE 3.28* 3.21* 3.60* 4.63* 4.64*  CALCIUM  9.9 9.6 8.9 8.7* 8.9  PHOS  --   --   --   --  3.9    Liver Function Tests: Recent Labs  Lab 02/09/24 1226 02/10/24 0811 02/11/24 0535 02/12/24 0511 02/13/24 0931  AST 20 18 77* 50*  --   ALT 18 15 77* 67*  --   ALKPHOS 175* 160* 117 100  --   BILITOT 0.5 0.7 0.6 0.4  --   PROT 6.5 6.1* 5.5* 5.5*  --   ALBUMIN  4.0 3.7 3.4* 3.1* 3.2*   Recent Labs  Lab 02/09/24 1226 02/10/24 0811  LIPASE 71* 40   No results for input(s): AMMONIA in the last 168 hours.  CBC: Recent Labs  Lab  02/09/24 1226 02/10/24 0811 02/11/24 0535 02/12/24 0511 02/13/24 0931  WBC 9.7 10.0 14.9* 10.7* 8.2  HGB 12.9* 12.4* 10.8* 10.1* 11.0*  HCT 38.0* 36.3* 32.8* 30.5* 32.9*  MCV 92.5 91.7 93.7 93.3 92.9  PLT 142* 120* 103* 106* 125*    Cardiac Enzymes: No results for input(s): CKTOTAL, CKMB, CKMBINDEX, TROPONINI in the last 168 hours.  BNP: Invalid input(s): POCBNP  CBG: Recent Labs  Lab 02/12/24 1733 02/12/24 2345 02/13/24 0518 02/13/24 0759 02/13/24 1147  GLUCAP 87 104* 113* 96 126*    Microbiology: Results for orders placed or performed during the hospital encounter of 11/25/22  Surgical pcr screen     Status: None   Collection Time: 11/26/22  4:30 AM   Specimen: Nasal Mucosa; Nasal Swab  Result Value Ref Range Status   MRSA, PCR NEGATIVE NEGATIVE Final   Staphylococcus aureus NEGATIVE NEGATIVE Final    Comment: (NOTE) The Xpert SA Assay (FDA approved for NASAL specimens in patients 58 years of age and older), is one component of a comprehensive surveillance program. It is not intended to diagnose infection nor to guide or monitor treatment. Performed at [facility], [address] [place], [place] [address]  Coagulation Studies: No results for input(s): LABPROT, INR in the last 72 hours.  Urinalysis: No results for input(s): COLORURINE, LABSPEC, PHURINE, GLUCOSEU, HGBUR, BILIRUBINUR, KETONESUR, PROTEINUR, UROBILINOGEN, NITRITE, LEUKOCYTESUR in the last 72 hours.  Invalid input(s): APPERANCEUR     Imaging: No results found.   Medications:    piperacillin -tazobactam (ZOSYN )  IV 2.25 g (02/13/24 1151)    acetaminophen   1,000 mg Oral Q6H   allopurinol   100 mg Oral Daily   amLODipine   5 mg Oral Daily   aspirin  EC  81 mg Oral Daily   atorvastatin   40 mg Oral Daily   dextrose   25 g Intravenous Once   feeding supplement  237 mL Oral BID BM   finasteride   5 mg Oral Daily   insulin  aspart  0-9 Units  Subcutaneous TID WC   levothyroxine   200 mcg Oral Q0600   metoprolol  succinate  100 mg Oral Daily   pantoprazole  (PROTONIX ) IV  40 mg Intravenous Q12H   polyethylene glycol  17 g Oral Daily   tamsulosin   0.4 mg Oral QPC supper   timolol   1 drop Both Eyes Daily   hydrALAZINE , magnesium  hydroxide, morphine  injection, ondansetron  **OR** ondansetron  (ZOFRAN ) IV, oxyCODONE , traZODone   Assessment/ Plan:  Alec White is a 83 y.o.  male PMH of chronic kidney disease stage IV, hypertension, diabetes, nephrolithiasis, BPH, and chronic back pain with multiple surgeries admitted for cholecystitis and is now s/p cholecystectomy. He is know to our practice and is followed by Dr. Dominica.  Acute kidney injury on chronic Kidney Disease Stage IV with the most recent GFR <20 . 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 Baseline creatinine 3.86 in October.  Creatinine appears to [place] today. Encouraged to maintain oral intake, requested liberalized diet by primary. Would prefer creatinine decreasing prior to discharge. Will schedule follow up appt with Dr Korrapati at discharge.    Lab Results  Component Value Date   CREATININE 4.64 (H) 02/13/2024   CREATININE 4.63 (H) 02/12/2024   CREATININE 3.60 (H) 02/11/2024    Intake/Output Summary (Last 24 hours) at 02/13/2024 1542 Last data filed at 02/13/2024 1300 Gross per 24 hour  Intake 1075.76 ml  Output 1295 ml  Net -219.24 ml    Hypertension   Home medications include metoprolol  and furosemide. Diuretics on hold. Currently on amlodipine , metoprolol  and PRN hydralazine .  Type 2 diabetes mellitus  Most recent A1C 5.5 on 02/10/24 On sliding scale while inpatient, managed by primary team.  Anemia of chronic kidney disease  Hgb 11.0.   Secondary Hyperparathyroidism  PTH  81 08/24/2023  Calcium  and phos within optimal range. Will monitor during this admission.    LOS: 4 Alec White 12/29/20253:42 PM  "

## 2024-02-13 NOTE — TOC CM/SW Note (Addendum)
 Transition of Care [facility]) - Inpatient Brief Assessment   Patient Details  Name: Alec White MRN: [id number] Date of Birth: 15-Sep-1940  [facility]) CM/SW Contact:    Lauraine JAYSON Carpen, LCSW Phone Number: 02/13/2024, 10:25 AM   Clinical Narrative: CSW reviewed chart. Patient gets outpatient PT at [facility]. Will send them a new order at discharge. SDOH flag for social isolation. Resources added to AVS. No other TOC needs identified at this time.  4:13 pm: Faxed outpatient PT order to [facility].  Transition of Care Asessment: Insurance and Status: Insurance coverage has been reviewed Patient has primary care physician: Yes Home environment has been reviewed: Single family home Prior level of function:: Not documented Prior/Current Home Services: No current home services Social Drivers of Health Review: SDOH reviewed interventions complete Readmission risk has been reviewed: Yes Transition of care needs: transition of care needs identified, TOC will continue to follow

## 2024-02-14 ENCOUNTER — Encounter: Payer: Self-pay | Admitting: Surgery

## 2024-02-14 ENCOUNTER — Other Ambulatory Visit: Payer: Self-pay

## 2024-02-14 LAB — COMPREHENSIVE METABOLIC PANEL WITH GFR
ALT: 44 U/L (ref 0–44)
AST: 28 U/L (ref 15–41)
Albumin: 2.9 g/dL — ABNORMAL LOW (ref 3.5–5.0)
Alkaline Phosphatase: 107 U/L (ref 38–126)
Anion gap: 12 (ref 5–15)
BUN: 56 mg/dL — ABNORMAL HIGH (ref 8–23)
CO2: 19 mmol/L — ABNORMAL LOW (ref 22–32)
Calcium: 9.3 mg/dL (ref 8.9–10.3)
Chloride: 106 mmol/L (ref 98–111)
Creatinine, Ser: 4.18 mg/dL — ABNORMAL HIGH (ref 0.61–1.24)
GFR, Estimated: 14 mL/min — ABNORMAL LOW
Glucose, Bld: 88 mg/dL (ref 70–99)
Potassium: 4.4 mmol/L (ref 3.5–5.1)
Sodium: 138 mmol/L (ref 135–145)
Total Bilirubin: 0.5 mg/dL (ref 0.0–1.2)
Total Protein: 5.6 g/dL — ABNORMAL LOW (ref 6.5–8.1)

## 2024-02-14 LAB — GLUCOSE, CAPILLARY
Glucose-Capillary: 87 mg/dL (ref 70–99)
Glucose-Capillary: 135 mg/dL — ABNORMAL HIGH (ref 70–99)

## 2024-02-14 MED ORDER — OXYCODONE HCL 5 MG PO TABS
2.5000 mg | ORAL_TABLET | ORAL | 0 refills | Status: AC | PRN
  Filled 2024-02-14: qty 5, 1d supply, fill #0

## 2024-02-14 MED ORDER — ACETAMINOPHEN 325 MG PO TABS
650.0000 mg | ORAL_TABLET | Freq: Four times a day (QID) | ORAL | 0 refills | Status: AC | PRN
  Filled 2024-02-14: qty 30, 4d supply, fill #0

## 2024-02-14 MED ORDER — AMOXICILLIN-POT CLAVULANATE 500-125 MG PO TABS
1.0000 | ORAL_TABLET | Freq: Two times a day (BID) | ORAL | 0 refills | Status: AC
  Filled 2024-02-14: qty 10, 5d supply, fill #0

## 2024-02-14 NOTE — Progress Notes (Signed)
 " [facility]  ROUNDING NOTE   Subjective:  Patient is s/p cholecystectomy with JP drain.   Update Patient laying in bed Wife at bedside Reports elevated BP today Remains on room air No lower extremity edema  Creatinine 4.18   Objective:  Vital signs in last 24 hours:  Temp:  [97.7 F (36.5 C)-98.3 F (36.8 C)] 98 F (36.7 C) (12/30 0830) Pulse Rate:  [71-80] 74 (12/30 0830) Resp:  [17-18] 17 (12/30 0830) BP: (149-201)/(58-75) 149/69 (12/30 1334) SpO2:  [96 %-99 %] 99 % (12/30 0830)  Weight change:  Filed Weights   02/09/24 1224  Weight: 82.1 kg    Intake/Output: I/O last 3 completed shifts: In: 1075.8 [P.O.:420; I.V.:655.8] Out: 2650 [Urine:2650]   Intake/Output this shift:  Total I/O In: -  Out: 350 [Urine:350]  Physical Exam: General: NAD  Head: Normocephalic  Eyes: Anicteric  Lungs:  Clear to auscultation  Heart: Regular rate   Abdomen:  Soft, Rt JP drain in place  Extremities:  No peripheral edema.  Neurologic: Alert, awake, conversant  Skin: Warm, dry.  Access: None    Basic Metabolic Panel: Recent Labs  Lab 02/10/24 0811 02/11/24 0535 02/12/24 0511 02/13/24 0931 02/14/24 0613  NA 137 140 135 137 138  K 4.9 5.2* 4.4 4.1 4.4  CL 106 109 103 104 106  CO2 21* 21* 20* 22 19*  GLUCOSE 128* 143* 83 92 88  BUN 42* 46* 61* 63* 56*  CREATININE 3.21* 3.60* 4.63* 4.64* 4.18*  CALCIUM  9.6 8.9 8.7* 8.9 9.3  PHOS  --   --   --  3.9  3.9  --     Liver Function Tests: Recent Labs  Lab 02/09/24 1226 02/10/24 0811 02/11/24 0535 02/12/24 0511 02/13/24 0931 02/14/24 0613  AST 20 18 77* 50*  --  28  ALT 18 15 77* 67*  --  44  ALKPHOS 175* 160* 117 100  --  107  BILITOT 0.5 0.7 0.6 0.4  --  0.5  PROT 6.5 6.1* 5.5* 5.5*  --  5.6*  ALBUMIN  4.0 3.7 3.4* 3.1* 3.2* 2.9*   Recent Labs  Lab 02/09/24 1226 02/10/24 0811  LIPASE 71* 40   No results for input(s): AMMONIA in the last 168 hours.  CBC: Recent Labs  Lab  02/09/24 1226 02/10/24 0811 02/11/24 0535 02/12/24 0511 02/13/24 0931  WBC 9.7 10.0 14.9* 10.7* 8.2  HGB 12.9* 12.4* 10.8* 10.1* 11.0*  HCT 38.0* 36.3* 32.8* 30.5* 32.9*  MCV 92.5 91.7 93.7 93.3 92.9  PLT 142* 120* 103* 106* 125*    Cardiac Enzymes: No results for input(s): CKTOTAL, CKMB, CKMBINDEX, TROPONINI in the last 168 hours.  BNP: Invalid input(s): POCBNP  CBG: Recent Labs  Lab 02/13/24 1547 02/13/24 1619 02/13/24 2134 02/14/24 0832 02/14/24 1138  GLUCAP 64* 132* 88 87 135*    Microbiology: Results for orders placed or performed during the hospital encounter of 11/25/22  Surgical pcr screen     Status: None   Collection Time: 11/26/22  4:30 AM   Specimen: Nasal Mucosa; Nasal Swab  Result Value Ref Range Status   MRSA, PCR NEGATIVE NEGATIVE Final   Staphylococcus aureus NEGATIVE NEGATIVE Final    Comment: (NOTE) The Xpert SA Assay (FDA approved for NASAL specimens in patients 57 years of age and older), is one component of a comprehensive surveillance program. It is not intended to diagnose infection nor to guide or monitor treatment. Performed at [facility], [address] [place],  [place] [address]     Coagulation Studies: No results for input(s): LABPROT, INR in the last 72 hours.  Urinalysis: No results for input(s): COLORURINE, LABSPEC, PHURINE, GLUCOSEU, HGBUR, BILIRUBINUR, KETONESUR, PROTEINUR, UROBILINOGEN, NITRITE, LEUKOCYTESUR in the last 72 hours.  Invalid input(s): APPERANCEUR     Imaging: No results found.   Medications:    piperacillin -tazobactam (ZOSYN )  IV 2.25 g (02/14/24 1111)    acetaminophen   1,000 mg Oral Q6H   allopurinol   100 mg Oral Daily   amLODipine   5 mg Oral Daily   aspirin  EC  81 mg Oral Daily   atorvastatin   40 mg Oral Daily   dextrose   25 g Intravenous Once   feeding supplement  237 mL Oral BID BM   finasteride   5 mg Oral Daily   insulin  aspart  0-9 Units  Subcutaneous TID WC   levothyroxine   200 mcg Oral Q0600   metoprolol  succinate  100 mg Oral Daily   pantoprazole  (PROTONIX ) IV  40 mg Intravenous Q12H   polyethylene glycol  17 g Oral Daily   tamsulosin   0.4 mg Oral QPC supper   timolol   1 drop Both Eyes Daily   hydrALAZINE , magnesium  hydroxide, morphine  injection, ondansetron  **OR** ondansetron  (ZOFRAN ) IV, oxyCODONE , traZODone   Assessment/ Plan:  [name] is a 83 y.o.  male PMH of chronic kidney disease stage IV, hypertension, diabetes, nephrolithiasis, BPH, and chronic back pain with multiple surgeries admitted for cholecystitis and is now s/p cholecystectomy. He is know to our practice and is followed by Dr. Dominica.  Acute kidney injury on chronic Kidney Disease Stage IV with the most recent GFR <20 . 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 Baseline creatinine 3.86 in October.  Creatinine shows improvement today. Adequate urine output recorded. Patient has follow up appt with Dr Dominica on Monday.    Lab Results  Component Value Date   CREATININE 4.18 (H) 02/14/2024   CREATININE 4.64 (H) 02/13/2024   CREATININE 4.63 (H) 02/12/2024    Intake/Output Summary (Last 24 hours) at 02/14/2024 1348 Last data filed at 02/14/2024 1152 Gross per 24 hour  Intake --  Output 1750 ml  Net -1750 ml    Hypertension   Home medications include metoprolol  and furosemide. Diuretics on hold. Currently on amlodipine , metoprolol  and PRN hydralazine .  Type 2 diabetes mellitus  Most recent A1C 5.5 on 02/10/24 On sliding scale while inpatient, managed by primary team.  Anemia of chronic kidney disease  Hgb 11.0, on 12/29.   Secondary Hyperparathyroidism  PTH  81 08/24/2023  Bone minerals within optimal range.    LOS: 5 Navy Rothschild 12/30/20251:48 PM  "

## 2024-02-14 NOTE — Progress Notes (Signed)
 Mobility Specialist Progress Note:    02/14/24 1334  Therapy Vitals  BP (!) 149/69  Patient Position (if appropriate) Sitting  Mobility  Activity Ambulated with assistance  Level of Assistance Contact guard assist, steadying assist  Assistive Device Front wheel walker  Distance Ambulated (ft) 25 ft  Range of Motion/Exercises Active;All extremities  Activity Response Tolerated well  Mobility visit 1 Mobility  Mobility Specialist Start Time (ACUTE ONLY) 1326  Mobility Specialist Stop Time (ACUTE ONLY) 1352  Mobility Specialist Time Calculation (min) (ACUTE ONLY) 26 min   Pt received in chair, wife at bedside. Agreeable to mobility, required CGA to stand and ambulate with RW. Tolerated well, see BP below. Returned pt to chair, care team notified. All needs met.  Before ambulation: BP 149/69 mmHg After ambulation: BP 174/77 mmHg  Sherrilee Ditty Mobility Specialist Please contact via Special Educational Needs Teacher or  Rehab office at [phone]

## 2024-02-14 NOTE — Inpatient Diabetes Management (Addendum)
 Inpatient Diabetes Program Recommendations  AACE/ADA: New Consensus Statement on Inpatient Glycemic Control   Target Ranges:  Prepandial:   less than 140 mg/dL      Peak postprandial:   less than 180 mg/dL (1-2 hours)      Critically ill patients:  140 - 180 mg/dL    Latest Reference Range & Units 02/13/24 07:59 02/13/24 11:47 02/13/24 15:28 02/13/24 15:47 02/13/24 16:19 02/13/24 21:34  Glucose-Capillary 70 - 99 mg/dL 96 873 (H) 48 (L) 64 (L) 132 (H) 88   Review of Glycemic Control  Diabetes history: DM2 Outpatient Diabetes medications: Jardiance  10 mg twice a week Current orders for Inpatient glycemic control: Novolog  0-9 units TID with meals  Inpatient Diabetes Program Recommendations:    Insulin : CBG down to 48 mg/dl at 84:71 on 87/70 after getting Novolog  correction and Novolog  correction scale was decreased from 0-15 units TID. If any other hypoglycemic episodes, may want to consider decreasing Novolog  correction to 0-6 units TID.  Thanks, Earnie Gainer, RN, MSN, CDCES Diabetes Coordinator Inpatient Diabetes Program [phone] (Team Pager from 8am to 5pm)

## 2024-02-14 NOTE — Plan of Care (Signed)
" °  Problem: Education: Goal: Ability to describe self-care measures that may prevent or decrease complications (Diabetes Survival Skills Education) will improve Outcome: Progressing   Problem: Coping: Goal: Ability to adjust to condition or change in health will improve Outcome: Progressing   Problem: Health Behavior/Discharge Planning: Goal: Ability to identify and utilize available resources and services will improve Outcome: Progressing   Problem: Nutritional: Goal: Maintenance of adequate nutrition will improve Outcome: Progressing   Problem: Skin Integrity: Goal: Risk for impaired skin integrity will decrease Outcome: Progressing   Problem: Elimination: Goal: Will not experience complications related to bowel motility Outcome: Progressing   Problem: Pain Managment: Goal: General experience of comfort will improve and/or be controlled Outcome: Progressing   Problem: Safety: Goal: Ability to remain free from injury will improve Outcome: Progressing   "

## 2024-02-14 NOTE — Discharge Summary (Addendum)
 " Physician Discharge Summary   Patient: Alec White MRN: [id number] DOB: [birth year]  Admit date:     02/09/2024  Discharge date: 02/14/2024  Discharge Physician: Laree Lock   PCP: Cleotilde Oneil FALCON, MD   Recommendations at discharge:   Follow-up with PCP within 1 week -repeat CMP, CBC Monitor BP  Follow-up with nephrology outpatient within [redacted] week Along with surgery outpatient -drain management  Outpatient PT.   Discharge Diagnoses: Principal Problem:   Symptomatic cholelithiasis Active Problems:   Hypertensive urgency   Acute kidney injury superimposed on chronic kidney disease   Type 2 diabetes mellitus with chronic kidney disease, without long-term current use of insulin  (HCC)   Hypothyroidism   Dyslipidemia   BPH (benign prostatic hyperplasia)   Calculus of gallbladder with acute cholecystitis without obstruction   Calculus of gallbladder with acute cholecystitis and obstruction  Hospital Course: Alec White is a 83 y.o. Caucasian male with medical history significant for coronary artery disease, stage 4 chronic kidney disease, essential hypertension, hypothyroidism, gout and dyslipidemia, type 2 diabetes mellitus presented to the ER due to acute onset of epigastric and RUQ pain.  On presentation was found to have elevated blood pressure 195/86, hypertensive urgency likely due to uncontrolled pain. Workup revealed findings consistent with cholelithiasis with cholecystitis, seen by general surgery.  Hospital course complicated by AKI on CKD, seen by nephrology, improving.  Symptomatic cholelithiasis Acute gangrenous cholecystitis Leukocytosis resolved, LFTs improving S/P laparoscopic cholecystectomy 12/26 was on IV Zosyn , discharged on p.o. Augmentin to complete 10 days Reported having leakage around the drain site, discussed with surgery Dr. Jordis, recommends needs a dressing change with dry gauze and remove all dressings Pain management with Tylenol , oxycodone   prn Follow-up with surgery outpatient  Hypertensive urgency - resolved HTN Suspect likely due to pain amlodipine  5 mg, metoprolol  100 XL  AKI on chronic kidney disease stage IV Reviewed care everywhere, baseline creatinine 3.5-3.6, creatinine bumped to 4.63 -> 4.18, improving s/p IV fluids, Encourage adequate p.o. intake Seen by nephrology, follow-up outpatient on Monday 01/05   Mild hyperkalemia Resolved s/p Lokelma    Normocytic anemia due to CKD Mild drop in hemoglobin post op and s/p IV fluids Stable   Hx CAD Elevated troponin likely secondary to hypertensive urgency, downtrended Aspirin  daily  Type 2 diabetes mellitus with chronic kidney disease, without long-term current use of insulin  continue Jardiance    BPH (benign prostatic hyperplasia) continue Flomax  and Proscar .   Dyslipidemia Statin   Hypothyroidism continue Synthroid    Gout Continue allopurinol    Incidental findings Bilateral nonobstructive nephrolithiasis   Outpatient physical therapy at discharge   Pain control - [place]  Controlled Substance Reporting System database was reviewed. and patient was instructed, not to drive, operate heavy machinery, perform activities at heights, swimming or participation in water activities or provide baby-sitting services while on Pain, Sleep and Anxiety Medications; until their outpatient Physician has advised to do so again. Also recommended to not to take more than prescribed Pain, Sleep and Anxiety Medications.  Consultants: Surgery, nephrology Procedures performed: Laparoscopic cholecystectomy 12/26 Disposition: Home Diet recommendation:  Discharge Diet Orders (From admission, onward)     Start     Ordered   02/14/24 0000  Diet Carb Modified        02/14/24 1529            DISCHARGE MEDICATION: Allergies as of 02/14/2024       Reactions   Tramadol  Other (See Comments)   Keeps him wide awake  Medication List     TAKE these  medications    acetaminophen  325 MG tablet Commonly known as: TYLENOL  Take 2 tablets (650 mg total) by mouth every 6 (six) hours as needed for mild pain (pain score 1-3) (pain score 1-3 or temp > 100.5). What changed: how much to take   allopurinol  100 MG tablet Commonly known as: ZYLOPRIM  Take 100 mg by mouth daily.   amLODipine  5 MG tablet Commonly known as: NORVASC  Take 5 mg by mouth daily.   amoxicillin-clavulanate 500-125 MG tablet Commonly known as: AUGMENTIN Take 1 tablet by mouth 2 (two) times daily for 5 days.   aspirin  EC 81 MG tablet Take 1 tablet (81 mg total) by mouth 2 (two) times daily. Swallow whole. What changed: when to take this   atorvastatin  40 MG tablet Commonly known as: LIPITOR Take 40 mg by mouth daily.   cholecalciferol  25 MCG (1000 UNIT) tablet Commonly known as: VITAMIN D3 Take 5,000 Units by mouth daily.   empagliflozin  10 MG Tabs tablet Commonly known as: JARDIANCE  Take 10 mg by mouth 2 (two) times a week.   finasteride  5 MG tablet Commonly known as: PROSCAR  Take 5 mg by mouth daily.   levothyroxine  200 MCG tablet Commonly known as: SYNTHROID  Take 200 mcg by mouth daily before breakfast.   metoprolol  succinate 100 MG 24 hr tablet Commonly known as: TOPROL -XL Take 100 mg by mouth daily. Take with or immediately following a meal.   oxyCODONE  5 MG immediate release tablet Commonly known as: Oxy IR/ROXICODONE  Take 0.5-1 tablets (2.5-5 mg total) by mouth every 4 (four) hours as needed for moderate pain (pain score 4-6) or severe pain (pain score 7-10).   pantoprazole  40 MG tablet Commonly known as: PROTONIX  Take 40 mg by mouth daily.   polyethylene glycol 17 g packet Commonly known as: MIRALAX  / GLYCOLAX  Take 17 g by mouth daily.   tamsulosin  0.4 MG Caps capsule Commonly known as: FLOMAX  Take 0.4 mg by mouth.   timolol  0.5 % ophthalmic solution Commonly known as: BETIMOL  Place 1 drop into both eyes daily.                Discharge Care Instructions  (From admission, onward)           Start     Ordered   02/14/24 0000  Discharge wound care:       Comments: dressing change w dry gauze and removing old dressings three times a week, around the drain   02/14/24 1529   02/11/24 0000  Discharge wound care:       Comments: Please empty and record drain output twice daily   02/11/24 1312            Follow-up Information     Cleotilde Oneil FALCON, MD Follow up.   Specialty: Internal Medicine Why: Hospital follow up Contact information: [address] [facility] [place] [address] [phone]         Terryl Arthea SAUNDERS, PA-C Follow up on 02/22/2024.   Specialty: Physician Assistant Contact information: [address] [place] [address] [phone]                Discharge Exam: Fredricka Weights   02/09/24 1224  Weight: 82.1 kg   GENERAL:  83 y.o.-year-old Caucasian male patient lying in the bed with no acute distress.  NECK:  Supple, no jugular venous distention LUNGS: Normal breath sounds bilaterally, no wheezing, rales,rhonchi or crepitation CARDIOVASCULAR: Regular  rate and rhythm, S1, S2 normal. No murmurs, rubs, or gallops.  ABDOMEN: Soft, nondistended, non tender, JP drain with serosanguineous output EXTREMITIES: No pedal edema, cyanosis, or clubbing.  NEUROLOGIC: AO x 3, no gross focal deficits  Condition at discharge: good  The results of significant diagnostics from this hospitalization (including imaging, microbiology, ancillary and laboratory) are listed below for reference.   Imaging Studies: US  Abdomen Limited RUQ (LIVER/GB) Result Date: 02/09/2024 CLINICAL DATA:  Upper abdominal pain. EXAM: ULTRASOUND ABDOMEN LIMITED RIGHT UPPER QUADRANT COMPARISON:  None Available. FINDINGS: Gallbladder: Multiple shadowing echogenic gallstones are seen within the gallbladder lumen. The largest measures approximately 1.3 cm. There is no  evidence of gallbladder wall thickening (2.4 mm). No sonographic Murphy sign noted by sonographer. Common bile duct: Diameter: 2.1 mm Liver: The left lobe of the liver is poorly visualized secondary to overlying bowel gas. No focal lesion identified. Within normal limits in parenchymal echogenicity. Portal vein is patent on color Doppler imaging with normal direction of blood flow towards the liver. Other: None. IMPRESSION: Cholelithiasis without evidence of acute cholecystitis. Electronically Signed   By: Suzen Dials M.D.   On: 02/09/2024 16:42   CT ABDOMEN PELVIS WO CONTRAST Result Date: 02/09/2024 EXAM: CT ABDOMEN AND PELVIS WITHOUT CONTRAST 02/09/2024 03:27:50 PM TECHNIQUE: CT of the abdomen and pelvis was performed without the administration of intravenous contrast. Multiplanar reformatted images are provided for review. Automated exposure control, iterative reconstruction, and/or weight-based adjustment of the mA/kV was utilized to reduce the radiation dose to as low as reasonably achievable. COMPARISON: 07/25/2018 CLINICAL HISTORY: Abdominal pain, acute, nonlocalized. Bilateral cramping upper abdominal pain since this morning. FINDINGS: LOWER CHEST: Bibasilar scarring. Mild cardiomegaly. Multivessel coronary artery calcification. LIVER: Subcentimeter central left hepatic lobe cysts. No intrahepatic biliary duct dilatation. GALLBLADDER AND BILE DUCTS: Gallstones up to 8 mm. Mild-to-moderate gallbladder distention. No biliary ductal dilatation. SPLEEN: Normal in size and morphology. PANCREAS: Normal, without duct dilatation or acute inflammation. ADRENAL GLANDS: Normal, without mass. KIDNEYS, URETERS AND BLADDER: Bilateral punctate renal collecting system calculi. Subcentimeter interpolar left renal angiomyolipoma. Bilateral low-density renal lesions are likely cysts at up to 2.6 cm. An interpolar right renal 1.8 cm lesion measures 21 HU, likely a minimally complex cyst. No follow up indicated. Mild  bilateral renal cortical thinning. No hydronephrosis. No hydroureter or ureteric stone. No bladder calculi. Mild bladder wall irregularity. GI AND BOWEL: Normal stomach, without wall thickening. Normal small bowel caliber. Normal colon and terminal ileum. Appendix not visualized. PERITONEUM AND RETROPERITONEUM: No ascites. No free air. VASCULATURE: Aorta is normal in caliber. Aortic atherosclerosis. LYMPH NODES: No lymphadenopathy. REPRODUCTIVE ORGANS: Moderate prostatomegaly. BONES AND SOFT TISSUES: Left proximal femur fixation. Lumbosacral spine fixation. Dorsal spinal stimulator. Thoracolumbar spondylosis. Beam hardening artifact from lumbar spine hardware and battery for dorsal spinal stimulator. IMPRESSION: 1. Cholelithiasis with gallbladder distention, suspicious for acute cholecystitis. Consider right upper quadrant ultrasound. 2. Bilateral nonobstructive nephrolithiasis. 3. Prostatomegaly with mild bladder wall irregularity, suggesting a component of outlet obstruction. 4. Incidental findings include aortic atherosclerosis (ICD10-I70.0) and coronary artery atherosclerosis. Electronically signed by: Rockey Kilts MD 02/09/2024 03:36 PM EST RP Workstation: [id number]    Microbiology: Results for orders placed or performed during the hospital encounter of 11/25/22  Surgical pcr screen     Status: None   Collection Time: 11/26/22  4:30 AM   Specimen: Nasal Mucosa; Nasal Swab  Result Value Ref Range Status   MRSA, PCR NEGATIVE NEGATIVE Final   Staphylococcus aureus NEGATIVE NEGATIVE Final    Comment: (NOTE)  The Xpert SA Assay (FDA approved for NASAL specimens in patients 49 years of age and older), is one component of a comprehensive surveillance program. It is not intended to diagnose infection nor to guide or monitor treatment. Performed at [facility], [address] [place], [place] [address]     Labs: CBC: Recent Labs  Lab 02/09/24 1226 02/10/24 9188 02/11/24 0535  02/12/24 0511 02/13/24 0931  WBC 9.7 10.0 14.9* 10.7* 8.2  HGB 12.9* 12.4* 10.8* 10.1* 11.0*  HCT 38.0* 36.3* 32.8* 30.5* 32.9*  MCV 92.5 91.7 93.7 93.3 92.9  PLT 142* 120* 103* 106* 125*   Basic Metabolic Panel: Recent Labs  Lab 02/10/24 0811 02/11/24 0535 02/12/24 0511 02/13/24 0931 02/14/24 0613  NA 137 140 135 137 138  K 4.9 5.2* 4.4 4.1 4.4  CL 106 109 103 104 106  CO2 21* 21* 20* 22 19*  GLUCOSE 128* 143* 83 92 88  BUN 42* 46* 61* 63* 56*  CREATININE 3.21* 3.60* 4.63* 4.64* 4.18*  CALCIUM  9.6 8.9 8.7* 8.9 9.3  PHOS  --   --   --  3.9  3.9  --    Liver Function Tests: Recent Labs  Lab 02/09/24 1226 02/10/24 0811 02/11/24 0535 02/12/24 0511 02/13/24 0931 02/14/24 0613  AST 20 18 77* 50*  --  28  ALT 18 15 77* 67*  --  44  ALKPHOS 175* 160* 117 100  --  107  BILITOT 0.5 0.7 0.6 0.4  --  0.5  PROT 6.5 6.1* 5.5* 5.5*  --  5.6*  ALBUMIN  4.0 3.7 3.4* 3.1* 3.2* 2.9*   CBG: Recent Labs  Lab 02/13/24 1547 02/13/24 1619 02/13/24 2134 02/14/24 0832 02/14/24 1138  GLUCAP 64* 132* 88 87 135*    Discharge time spent: greater than 30 minutes.  Signed: Laree Lock, MD [facility] 02/14/2024 "

## 2024-02-15 ENCOUNTER — Ambulatory Visit

## 2024-02-17 ENCOUNTER — Encounter: Payer: Self-pay | Admitting: Surgery

## 2024-02-17 ENCOUNTER — Ambulatory Visit: Admitting: Surgery

## 2024-02-17 VITALS — BP 174/79 | HR 82 | Ht 67.0 in | Wt 179.0 lb

## 2024-02-17 DIAGNOSIS — K810 Acute cholecystitis: Secondary | ICD-10-CM

## 2024-02-17 DIAGNOSIS — Z09 Encounter for follow-up examination after completed treatment for conditions other than malignant neoplasm: Secondary | ICD-10-CM

## 2024-02-17 DIAGNOSIS — K8000 Calculus of gallbladder with acute cholecystitis without obstruction: Secondary | ICD-10-CM

## 2024-02-17 NOTE — Progress Notes (Signed)
 02/17/2024  HPI: Alec White is a 84 y.o. male s/p robotic cholecystectomy on 02/10/2024 with Dr. Jordis for acute gangrenous cholecystitis.  The patient surgery was difficult and he had a Blake drain in place postoperatively.  He also had AKI on top of chronic kidney disease which prolong his hospital stay.  He was eventually discharged on 02/14/2024.  The patient reports that over the last couple of days they have been having issues with the drain that has not been working as well and they have been unable to keep the bulb to suction.  He denies any worsening abdominal pain but reports discomfort at the drain insertion site.  He has been having approximately 20 to 30 mL of output prior to the drain malfunction.  He still feels weak and with a low appetite.  Vital signs: BP (!) 174/79   Pulse 82   Ht 5' 7 (1.702 m)   Wt 179 lb (81.2 kg)   SpO2 97%   BMI 28.04 kg/m    Physical Exam: Constitutional: No acute distress Abdomen: Soft, nondistended, appropriate tender to palpation.  Incisions are healing well and are clean, dry, intact with mild ecchymosis at the umbilical port site.  The patient's right drain has serosanguineous fluid with some clots within the tubing which were stripped down into the bulb.  The bulb itself was capped but it was full of air.  This was squeezed out and the cap was secured again.  Assessment/Plan: This is a 84 y.o. male s/p robotic assisted cholecystectomy.  - Discussed with patient that is not uncommon to be weak and with a low appetite after this surgery particularly given his presentation with gangrenous cholecystitis as well as his prolonged hospital stay due to AKI on top of chronic kidney disease.  Discussed with him that as his body continues to improve and heal from the surgery, he will start regaining his strength and appetite. - With regards to the drain, thing there was confusion about how to keep the drain to be charged.  Instructed the patient and his  wife about this.  I also stripped the tubing given of any clots that could have impeded some of the flow.  Given that they were almost borderline with the amount of fluid they are having some more function issues the last day and a half, I discussed with patient that it may be more prudent to wait until early next week to have him come back and remove the drain.  He is in agreement with this. - Patient will follow-up with me on 02/20/2024 at which time we will be able to remove his drain.   Aloysius Sheree Plant, MD [facility]

## 2024-02-17 NOTE — Patient Instructions (Addendum)
 GENERAL POST-OPERATIVE PATIENT INSTRUCTIONS   WOUND CARE INSTRUCTIONS:  Keep a dry clean dressing on the wound if there is drainage. The initial bandage may be removed after 24 hours.  Once the wound has quit draining you may leave it open to air.  If clothing rubs against the wound or causes irritation and the wound is not draining you may cover it with a dry dressing during the daytime.  Try to keep the wound dry and avoid ointments on the wound unless directed to do so.  If the wound becomes bright red and painful or starts to drain infected material that is not clear, please contact your physician immediately.  If the wound is mildly pink and has a thick firm ridge underneath it, this is normal, and is referred to as a healing ridge.  This will resolve over the next 4-6 weeks.  BATHING: You may shower if you have been informed of this by your surgeon. However, Please do not submerge in a tub, hot tub, or pool until incisions are completely sealed or have been told by your surgeon that you may do so.  DIET:  You may eat any foods that you can tolerate.  It is a good idea to eat a high fiber diet and take in plenty of fluids to prevent constipation.  If you do become constipated you may want to take a mild laxative or take ducolax tablets on a daily basis until your bowel habits are regular.  Constipation can be very uncomfortable, along with straining, after recent surgery.  ACTIVITY:  You are encouraged to cough and deep breath or use your incentive spirometer if you were given one, every 15-30 minutes when awake.  This will help prevent respiratory complications and low grade fevers post-operatively if you had a general anesthetic.  You may want to hug a pillow when coughing and sneezing to add additional support to the surgical area, if you had abdominal or chest surgery, which will decrease pain during these times.  You are encouraged to walk and engage in light activity for the next two weeks.  You  should not lift more than 20 pounds for 6 weeks total after surgery as it could put you at increased risk for complications.  Twenty pounds is roughly equivalent to a plastic bag of groceries. At that time- Listen to your body when lifting, if you have pain when lifting, stop and then try again in a few days. Soreness after doing exercises or activities of daily living is normal as you get back in to your normal routine.  MEDICATIONS:  Try to take narcotic medications and anti-inflammatory medications, such as tylenol , ibuprofen, naprosyn, etc., with food.  This will minimize stomach upset from the medication.  Should you develop nausea and vomiting from the pain medication, or develop a rash, please discontinue the medication and contact your physician.  You should not drive, make important decisions, or operate machinery when taking narcotic pain medication.  SUNBLOCK Use sun block to incision area over the next year if this area will be exposed to sun. This helps decrease scarring and will allow you avoid a permanent darkened area over your incision.  QUESTIONS:  Please feel free to call our office if you have any questions, and we will be glad to assist you. [phone]      Surgical Drain Home Care Surgical drains are placed during surgery. They are used to get rid of the extra fluid that can build up in a  wound after surgery. They can also help heal the wound. The kinds of drains include: Active drains. These use suction to pull drainage away from the wound. Drainage flows through a tube to a container outside of the body. With these drains, you need to keep the bulb or the container flat (compressed) at all times, except while being emptied. Flattening the bulb or container creates suction. One of the most common types of active drains is the Jackson-Pratt (JP) drain. Passive drains. These allow fluid to drain using gravity rather than suction. Drainage flows through a tube to a bandage  (dressing) or a container outside of the body. These drains do not need to be emptied. One of the most common types of passive drains is the Penrose drain. Right after surgery, drainage is often bright red and a little thicker than water. It may turn yellow or pink over time and become thinner. Your health care provider may remove the drain when the drainage stops or when the amount decreases to 1-2 tbsp (15-30 mL) in a 24-hour period. How to care for your surgical drain Care for your drain as told by your provider. Keep the skin around the drain dry and covered with a dressing at all times. This can help to prevent infection. If the drain is placed at your back, or in any other hard-to-reach area, ask someone to help you change the dressing, empty the drain, and check for infection. Changing the dressing Follow instructions from your provider about how to change your dressing. Change it at least once a day. Change it more often if needed to keep the dressing dry. Make sure you: Gather your supplies. These may include: Tape. Germ-free cleaning solution (sterile saline). Cotton swabs. Split gauze drain sponge: 4 x 4 inches (10 x 10 cm). Gauze square: 4 x 4 inches (10 x 10 cm). Wash your hands with soap and water for at least 20 seconds before and after you change your dressing. If soap and water are not available, use hand sanitizer. Remove the old dressing. Avoid using scissors to do that. Wash your hands with soap and water again after taking off the old dressing. Use sterile saline to clean the skin around the drain. You may need to use a cotton swab to clean the skin. Place the tube through the slit in a drain sponge. Place the drain sponge so that it covers your wound. Place the gauze square or another drain sponge on top of the drain sponge that is on the wound. Make sure the tube is between those layers. Tape the dressing to your skin. Then, tape the drainage tube to your skin 1-2 inches  (2.5-5 cm) below the place where the tube enters your body. Taping keeps the tube from pulling on any stitches (sutures) that you have. Wash your hands with soap and water. Write down the color of your drainage and how often you change your dressing. Emptying the active drain  Make sure you have a measuring cup that you can empty your drainage into. Wash your hands with soap and water for at least 20 seconds before and after you empty your drain. If soap and water are not available, use hand sanitizer. Loosen any pins or clips that hold the tube in place. If your provider tells you to strip the tube to prevent clots and tube blockages: Hold the tube at the skin with one hand. Use your other hand to pinch the tube with your thumb and first finger.  Gently move your fingers down the tube while squeezing very lightly. This clears any drainage, clots, or tissue from the tube. You may need to do this a few times a day to keep the tube clear. Do not pull on the tube. Open the bulb cap or the drain plug. Do not touch the inside of the cap or the bottom of the plug. Turn the device upside down and gently squeeze. Empty all of the drainage into the measuring cup. Compress the bulb or the container. Replace the cap or the plug. To compress the bulb or the container, squeeze it firmly in the middle while you close the cap or plug the container. Write down the amount of drainage that you have in each 24-hour period. If you have less than 2 tbsp (30 mL) of drainage during the 24 hours, contact your provider. Flush the drainage down the toilet. Wash your hands with soap and water. Checking for infection Check your drain area every day for signs of infection. Check for: Redness, swelling, or pain. Warmth. Pus or a bad smell. Drainage that looks cloudy. Tenderness or pressure at the spot where the drain leaves your body. Contact a health care provider if: You have any signs of infection around your drain  area. You have a fever or chills. The amount of drainage that you have stops all of a sudden, or the drainage increases rather than decreases. Your tube falls out. Your active drain does not stay compressed after you empty it. The tube gets detached from the bulb or container. This information is not intended to replace advice given to you by your health care provider. Make sure you discuss any questions you have with your health care provider. Document Revised: 09/18/2021 Document Reviewed: 09/18/2021 Elsevier Patient Education  2024 [employer].

## 2024-02-18 LAB — SURGICAL PATHOLOGY

## 2024-02-20 ENCOUNTER — Ambulatory Visit (INDEPENDENT_AMBULATORY_CARE_PROVIDER_SITE_OTHER): Admitting: Surgery

## 2024-02-20 ENCOUNTER — Encounter: Payer: Self-pay | Admitting: Surgery

## 2024-02-20 ENCOUNTER — Ambulatory Visit

## 2024-02-20 VITALS — BP 177/79 | HR 97 | Ht 67.0 in | Wt 178.0 lb

## 2024-02-20 DIAGNOSIS — K810 Acute cholecystitis: Secondary | ICD-10-CM

## 2024-02-20 DIAGNOSIS — Z09 Encounter for follow-up examination after completed treatment for conditions other than malignant neoplasm: Secondary | ICD-10-CM

## 2024-02-20 DIAGNOSIS — K8000 Calculus of gallbladder with acute cholecystitis without obstruction: Secondary | ICD-10-CM

## 2024-02-20 NOTE — Patient Instructions (Signed)

## 2024-02-20 NOTE — Progress Notes (Signed)
 02/20/2024  HPI: Alec White is a 84 y.o. male s/p robotic cholecystectomy on 02/10/2024 with Dr. Jordis for acute gangrenous cholecystitis.  The patient surgery was difficult and he had a Blake drain in place postoperatively.  He also had AKI on top of chronic kidney disease which prolong his hospital stay.  He was eventually discharged on 02/14/2024.    The patient was last seen on 02/17/2024.  At that time he had been having issues with the drain but this was dealt with in the office without issues.  He presents today for drain removal.  Patient reports that her he is having some diarrhea which has been improving.  He also has issues with changes to his taste buds but this has been going on since before surgery.  Vital signs: BP (!) 177/79   Pulse 97   Ht 5' 7 (1.702 m)   Wt 178 lb (80.7 kg)   SpO2 97%   BMI 27.88 kg/m    Physical Exam: Constitutional: No acute distress Abdomen: Soft, nondistended, appropriate tender to palpation.  Incisions are healing well and are clean, dry, intact with mild ecchymosis at the umbilical port site.  The patient's right drain has serosanguineous fluid and this was removed at bedside without complications.  Dry gauze dressing applied.  Assessment/Plan: This is a 84 y.o. male s/p robotic assisted cholecystectomy.  - Drain was removed today without complications.  Instructed them on how to do daily dry gauze dressing changes until the wound seals up. - Discussed with patient diarrhea is not uncommon after having gallbladder surgery as well as while taking antibiotics.  He just completed his antibiotic course so this should be improving with time. - Unclear as to the reason for potential taste bud changes.  However this has been ongoing since before surgery.  Recommended that he discuss this with his PCP to see if there is any medications that could be contributing to this. - Follow-up as needed.  Aloysius Sheree Plant, MD [facility]

## 2024-02-22 ENCOUNTER — Ambulatory Visit

## 2024-02-27 ENCOUNTER — Ambulatory Visit

## 2024-02-29 ENCOUNTER — Ambulatory Visit

## 2024-03-06 ENCOUNTER — Ambulatory Visit: Attending: Internal Medicine

## 2024-03-06 DIAGNOSIS — R262 Difficulty in walking, not elsewhere classified: Secondary | ICD-10-CM | POA: Diagnosis present

## 2024-03-06 DIAGNOSIS — R2681 Unsteadiness on feet: Secondary | ICD-10-CM | POA: Insufficient documentation

## 2024-03-06 DIAGNOSIS — R296 Repeated falls: Secondary | ICD-10-CM | POA: Insufficient documentation

## 2024-03-06 DIAGNOSIS — M6281 Muscle weakness (generalized): Secondary | ICD-10-CM | POA: Diagnosis present

## 2024-03-06 NOTE — Therapy (Signed)
 " OUTPATIENT PHYSICAL THERAPY TREATMENT/ REASSESSMENT   Patient Name: Alec White MRN: [id number] DOB:1940-06-30, 84 y.o., male Today's Date: 03/06/2024  PCP: Cleotilde Oneil FALCON, MD REFERRING PROVIDER: Cleotilde Oneil FALCON, MD  END OF SESSION:  PT End of Session - 03/06/24 1409     Visit Number 7    Number of Visits 24    Date for Recertification  04/17/24    Authorization Type [employer]: 01/24/24-04/17/24    Progress Note Due on Visit 10    PT Start Time 1403    PT Stop Time 1443    PT Time Calculation (min) 40 min    Equipment Utilized During Treatment Gait belt    Activity Tolerance Patient tolerated treatment well;No increased pain    Behavior During Therapy WFL for tasks assessed/performed          Past Medical History:  Diagnosis Date   Coronary artery disease    stents placed 20 years ago   Renal disorder    CKD stage 4   Past Surgical History:  Procedure Laterality Date   APPENDECTOMY     HIP PINNING,CANNULATED Left 11/26/2022   Procedure: PERCUTANEOUS FIXATION OF FEMORAL NECK;  Surgeon: Tobie Priest, MD;  Location: [facility] ORS;  Service: Orthopedics;  Laterality: Left;   spinal neurostimulator  08/31/2017   Patient Active Problem List   Diagnosis Date Noted   Calculus of gallbladder with acute cholecystitis without obstruction 02/10/2024   Calculus of gallbladder with acute cholecystitis and obstruction 02/10/2024   Symptomatic cholelithiasis 02/09/2024   Hypertensive urgency 02/09/2024   Dyslipidemia 02/09/2024   BPH (benign prostatic hyperplasia) 02/09/2024   Type 2 diabetes mellitus with chronic kidney disease, without long-term current use of insulin  (HCC) 02/09/2024   Acute kidney injury superimposed on chronic kidney disease 02/09/2024   Closed displaced fracture of left femoral neck (HCC) 11/25/2022   Chronic back pain 11/25/2022   Chronic use of opiate drug for therapeutic purpose 11/25/2022   CKD (chronic kidney disease) stage 4, GFR 15-29 ml/min (HCC)  11/25/2022   Hypothyroidism 11/25/2022   Essential hypertension 11/25/2022   Benign prostatic hyperplasia 06/10/2021   Type 2 diabetes mellitus with diabetic chronic kidney disease (HCC) 06/10/2021   Chronic gouty arthritis 04/20/2016   Hyperlipidemia, mixed 10/23/2015   CAD S/P percutaneous coronary angioplasty 02/04/2015   Diet-controlled diabetes mellitus (HCC) 02/04/2015   ONSET DATE: October 2024 is most recent REFERRING DIAG: R42 (ICD-10-CM) - Disequilibrium THERAPY DIAG:  Muscle weakness (generalized)  Difficulty in walking, not elsewhere classified  Unsteadiness on feet  Repeated falls  Rationale for Evaluation and Treatment: Rehabilitation  SUBJECTIVE:  SUBJECTIVE STATEMENT: Pt back after cholecystitis/cholecystectomy. Pt reports 6-8 weeks lifting restriction (8lbs). Pt has felt generally weak. No falls since then. Pt has used RW most of the time, has not resumed upwalker use. Pt reports turning off spine stimulator for gallbladder surgery, his pain has bee improved since.   PERTINENT HISTORY:  83yoM referred to OPPT by PCP after recurrent ~4-5 falls, took PT here from 10/06/23-12/08/23. Pt sustained a fall in the house on 12/08/23, sustained SDH, followed by neurosurgical, bleed found to be stable, then pt cleared for coming back to OPPT by neurosurgical on 01/16/24. Pt denies any patterns with falls. Says it seems like his legs give away but he has also tripped leading to a fall posteriorly onto his tailbone which has been most recent. Pt previously able to get up on his own if he has something he can hold onto with his UE, however more recently he has needed assistance to get up, his wife unable to provide this degree of assistance. Pt then become ill on christmas day and went to ED, need  cholecystectomy, in hospital x5 days, returns to PT on 03/06/24. PMH includes remote lumbar fusion with chronic pain, failed implementation of pain stimulator. Pt's pain remains ~7/10 with periodic exacerbations, but is generally poorly tolerant of time in upright, and not tolerant of erect postures. Pt found success here with upwalker and purchased his own in October, essentially able to double his distance tolerance and triple his self selected gait speed. Due to suspected tripping incident (as of 01/24/24), pt using SPC as little as possible, but using RW inside and 2nd RW outside of house; upwalker reserved for longer distances in community hence, has largely remained in the car since his fall. Pt return to clinic 01/24/24 reports feelings deconditioned in general, but mostly his balance is 50% of what it was on his last PT session. Pt also reports a 2nd recent fall weekend 12/6 moving a boxed christmas trede.   PAIN:  Are you having pain? 4-5/10 low back   PRECAUTIONS: Spine stimulator in place  WEIGHT BEARING RESTRICTIONS: No FALLS: Has patient fallen in last 6 months? Yes. Number of falls 7  LIVING ENVIRONMENT: Lives with: lives with their spouse Lives in: House/apartment Stairs: Yes: Internal: flight steps; on right going up and on left going up Has following equipment at home: Single point cane, Walker - 2 wheeled, shower chair, bed side commode, and Grab bars  PLOF: Independent  PATIENT GOALS: Improve his balance and his back pain some  OBJECTIVE:  Note: Objective measures were completed at evaluation unless otherwise noted.  10/15/23 FGA 12/30  01/24/24 -5xSTS: 8.97sec hands free, mild unsteady - : 10.53sec c RW  -TUG: 14.62sec c RW  -ABC Scale: 64.4%   03/06/24 -5xSTS:  13.9sec hands free  -TUG: 13.28sec c RW  - : 11.14 sec                                                                                              TREATMENT DATE 03/06/24:   -5xSTS -TUG -  twice  -overground AMB with upwalker (373ft) 2 minutes 30sec  -  STS from chair hands free x10  -Standing marching c BUE support on RW x30 -standing double heel raises x15 (limited height excursion)  -Standing marching c BUE support on RW x30 -standing double heel raises x15 (limited height excursion)    PATIENT EDUCATION: Education details: modified workouts since Person educated: Patient and Spouse Education method: Explanation Education comprehension: verbalized understanding  HOME EXERCISE PROGRAM:   GOALS: Goals reviewed with patient? No  SHORT TERM GOALS: Target date: 03/09/24  Pt will be independent with HEP to Improve balance and LE strength to reduce falls risk.  Baseline: 01/24/24: provided stepping activity at counter.  Goal status: INITIAL  LONG TERM GOALS: Target date: 04/17/24  Pt will demonstrate 5xSTS from elevated surface and feet on foam to < 13 seconds, hands free and without LOB to indicate improved postural control during high power transfers activities.  Baseline: 10/06/23: 15.45 seconds; 11/29/23: 8.97sec hands free;  01/24/24: 8.97sec hands free, mild unsteady firm surface Goal status: NEW  2.  Pt will demonstrate performance of 1088ft AMB c upwalker at >0.31m/s without >2/10 pain increase in back to promote improved capacity for limited community distance AMB.  Baseline: 02/06/24: 627ft is farthest distance since returning Goal status: NEW  3.  Pt will improve TUG to 11 seconds or less to demonstrate reduced falls risk.  Baseline: 10/06/23: 22 seconds no AD; 01/24/24: 14.62sec (c RW);  Goal status: NEW  4.  Pt will improve ABC Scale >25% to reveal improved confidence n performing basic mobility needed for household AMB.  Baseline: 01/24/24:64.4% Goal status: ONGOING   ASSESSMENT:  CLINICAL IMPRESSION: Pt returns after medical hiatus, remains weak since illness. Pt has not been very mobile at home, taking is slow and focus on safety at DC. Retested outcome  measures today showing mild decrease in 5xSTS, slight decrease in tug and . Overall tolerance to sustained walking is decreased quite a bit, limited to ~ 363ft. Patient will benefit from skilled physical therapy intervention to reduce deficits and impairments identified in evaluation, in order to reduce pain, improve quality of life, and maximize activity tolerance for ADL, IADL, and leisure/fitness. Physical therapy will help pt achieve long and short term goals of care.    OBJECTIVE IMPAIRMENTS: Abnormal gait, decreased balance, decreased knowledge of use of DME, decreased mobility, difficulty walking, decreased strength, decreased safety awareness, postural dysfunction, and pain.   ACTIVITY LIMITATIONS: carrying, standing, stairs, transfers, and locomotion level  PARTICIPATION LIMITATIONS: shopping and community activity  PERSONAL FACTORS: Age, Behavior pattern, Past/current experiences, Time since onset of injury/illness/exacerbation, and 3+ comorbidities: Hx of multiple spine surgeries, DM, HTN, prior hip fx, CKD are also affecting patient's functional outcome.   REHAB POTENTIAL: Fair Chronicity of symptoms  CLINICAL DECISION MAKING: Evolving/moderate complexity  EVALUATION COMPLEXITY: Moderate  PLAN: PT FREQUENCY: 1-2x/week PT DURATION: 12 weeks PLANNED INTERVENTIONS: 97164- PT Re-evaluation, 97110-Therapeutic exercises, 97530- Therapeutic activity, 97112- Neuromuscular re-education, [phone]- Self Care, [id number]- Gait training, Patient/Family education, Balance training, Stair training, and DME instructions  PLAN FOR NEXT SESSION: Expand HEP, dynamic balance training, activity tolerance training.   2:11 PM, 03/06/24 Peggye JAYSON Linear, PT, DPT Physical Therapist - [facility]  Outpatient Physical Therapy- Main [facility] [phone]    "

## 2024-03-14 ENCOUNTER — Ambulatory Visit: Admitting: Physical Therapy

## 2024-03-14 DIAGNOSIS — M6281 Muscle weakness (generalized): Secondary | ICD-10-CM

## 2024-03-14 DIAGNOSIS — R2681 Unsteadiness on feet: Secondary | ICD-10-CM

## 2024-03-14 DIAGNOSIS — R262 Difficulty in walking, not elsewhere classified: Secondary | ICD-10-CM

## 2024-03-14 DIAGNOSIS — R296 Repeated falls: Secondary | ICD-10-CM

## 2024-03-14 NOTE — Therapy (Unsigned)
 " OUTPATIENT PHYSICAL THERAPY TREATMENT   Patient Name: Alec White MRN: [id number] DOB:[birth year], 84 y.o., male Today's Date: 03/15/2024  PCP: Cleotilde Oneil FALCON, MD REFERRING PROVIDER: Cleotilde Oneil FALCON, MD  END OF SESSION:  PT End of Session - 03/14/24 1545     Visit Number 8    Number of Visits 24    Date for Recertification  04/17/24    Authorization Type [employer]: 01/24/24-04/17/24    Progress Note Due on Visit 10    PT Start Time 1534    PT Stop Time 1613    PT Time Calculation (min) 39 min    Equipment Utilized During Treatment Gait belt    Activity Tolerance Patient tolerated treatment well;No increased pain    Behavior During Therapy WFL for tasks assessed/performed           Past Medical History:  Diagnosis Date   Coronary artery disease    stents placed 20 years ago   Renal disorder    CKD stage 4   Past Surgical History:  Procedure Laterality Date   APPENDECTOMY     HIP PINNING,CANNULATED Left 11/26/2022   Procedure: PERCUTANEOUS FIXATION OF FEMORAL NECK;  Surgeon: Tobie Priest, MD;  Location: [facility] ORS;  Service: Orthopedics;  Laterality: Left;   spinal neurostimulator  08/31/2017   Patient Active Problem List   Diagnosis Date Noted   Calculus of gallbladder with acute cholecystitis without obstruction 02/10/2024   Calculus of gallbladder with acute cholecystitis and obstruction 02/10/2024   Symptomatic cholelithiasis 02/09/2024   Hypertensive urgency 02/09/2024   Dyslipidemia 02/09/2024   BPH (benign prostatic hyperplasia) 02/09/2024   Type 2 diabetes mellitus with chronic kidney disease, without long-term current use of insulin  (HCC) 02/09/2024   Acute kidney injury superimposed on chronic kidney disease 02/09/2024   Closed displaced fracture of left femoral neck (HCC) 11/25/2022   Chronic back pain 11/25/2022   Chronic use of opiate drug for therapeutic purpose 11/25/2022   CKD (chronic kidney disease) stage 4, GFR 15-29 ml/min (HCC) 11/25/2022    Hypothyroidism 11/25/2022   Essential hypertension 11/25/2022   Benign prostatic hyperplasia 06/10/2021   Type 2 diabetes mellitus with diabetic chronic kidney disease (HCC) 06/10/2021   Chronic gouty arthritis 04/20/2016   Hyperlipidemia, mixed 10/23/2015   CAD S/P percutaneous coronary angioplasty 02/04/2015   Diet-controlled diabetes mellitus (HCC) 02/04/2015   ONSET DATE: October 2024 is most recent REFERRING DIAG: R42 (ICD-10-CM) - Disequilibrium THERAPY DIAG:  Difficulty in walking, not elsewhere classified  Unsteadiness on feet  Repeated falls  Muscle weakness (generalized)  Rationale for Evaluation and Treatment: Rehabilitation  SUBJECTIVE:  SUBJECTIVE STATEMENT:  Pt reports no new falls or incidents since last session. Still feeling weak and having some back pain due to his long surgical history.   PERTINENT HISTORY:  83yoM referred to OPPT by PCP after recurrent ~4-5 falls, took PT here from 10/06/23-12/08/23. Pt sustained a fall in the house on 12/08/23, sustained SDH, followed by neurosurgical, bleed found to be stable, then pt cleared for coming back to OPPT by neurosurgical on 01/16/24. Pt denies any patterns with falls. Says it seems like his legs give away but he has also tripped leading to a fall posteriorly onto his tailbone which has been most recent. Pt previously able to get up on his own if he has something he can hold onto with his UE, however more recently he has needed assistance to get up, his wife unable to provide this degree of assistance. Pt then become ill on christmas day and went to ED, need cholecystectomy, in hospital x5 days, returns to PT on 03/06/24. PMH includes remote lumbar fusion with chronic pain, failed implementation of pain stimulator. Pt's pain remains ~7/10  with periodic exacerbations, but is generally poorly tolerant of time in upright, and not tolerant of erect postures. Pt found success here with upwalker and purchased his own in October, essentially able to double his distance tolerance and triple his self selected gait speed. Due to suspected tripping incident (as of 01/24/24), pt using SPC as little as possible, but using RW inside and 2nd RW outside of house; upwalker reserved for longer distances in community hence, has largely remained in the car since his fall. Pt return to clinic 01/24/24 reports feelings deconditioned in general, but mostly his balance is 50% of what it was on his last PT session. Pt also reports a 2nd recent fall weekend 12/6 moving a boxed christmas trede.   PAIN:  Are you having pain? 4-5/10 low back   PRECAUTIONS: Spine stimulator in place  WEIGHT BEARING RESTRICTIONS: No FALLS: Has patient fallen in last 6 months? Yes. Number of falls 7  LIVING ENVIRONMENT: Lives with: lives with their spouse Lives in: House/apartment Stairs: Yes: Internal: flight steps; on right going up and on left going up Has following equipment at home: Single point cane, Walker - 2 wheeled, shower chair, bed side commode, and Grab bars  PLOF: Independent  PATIENT GOALS: Improve his balance and his back pain some  OBJECTIVE:  Note: Objective measures were completed at evaluation unless otherwise noted.  10/15/23 FGA 12/30  01/24/24 -5xSTS: 8.97sec hands free, mild unsteady - : 10.53sec c RW  -TUG: 14.62sec c RW  -ABC Scale: 64.4%   03/06/24 -5xSTS:  13.9sec hands free  -TUG: 13.28sec c RW  - : 11.14 sec                                                                                              TREATMENT DATE 03/15/24:      TA- To improve functional movements patterns for everyday tasks   The patient completed 8 minutes at level(s) 3-6 on the NuStep using both BUE/BLE reciprocal movements to promote strength, endurance,  and cardiorespiratory  fitness. PT increased the resistance level and monitored the patient's response to the intervention throughout. The patient required min/mod/max cueing for technique and min level of assistance.   -overground AMB with upwalker (372ft) - instruction on proper height and posture for avoidance of back exacerbation with this.  Airex stance 3 x 30 sec  -overground AMB with upwalker (352ft)  Heel raises 2 x 15 with B UE assist - could not complete without UE   Unless otherwise stated, SBA was provided and gait belt donned in order to ensure pt safety  Pt required occasional rest breaks due fatigue, PT was attentive to when pt appeared to be tired or winded in order to prevent excessive fatigue.    PATIENT EDUCATION: Education details: modified workouts since Person educated: Patient and Spouse Education method: Explanation Education comprehension: verbalized understanding  HOME EXERCISE PROGRAM:   GOALS: Goals reviewed with patient? No  SHORT TERM GOALS: Target date: 03/09/24  Pt will be independent with HEP to Improve balance and LE strength to reduce falls risk.  Baseline: 01/24/24: provided stepping activity at counter.  Goal status: INITIAL  LONG TERM GOALS: Target date: 04/17/24  Pt will demonstrate 5xSTS from elevated surface and feet on foam to < 13 seconds, hands free and without LOB to indicate improved postural control during high power transfers activities.  Baseline: 10/06/23: 15.45 seconds; 11/29/23: 8.97sec hands free;  01/24/24: 8.97sec hands free, mild unsteady firm surface Goal status: NEW  2.  Pt will demonstrate performance of 1061ft AMB c upwalker at >0.62m/s without >2/10 pain increase in back to promote improved capacity for limited community distance AMB.  Baseline: 02/06/24: 663ft is farthest distance since returning Goal status: NEW  3.  Pt will improve TUG to 11 seconds or less to demonstrate reduced falls risk.  Baseline: 10/06/23: 22  seconds no AD; 01/24/24: 14.62sec (c RW);  Goal status: NEW  4.  Pt will improve ABC Scale >25% to reveal improved confidence n performing basic mobility needed for household AMB.  Baseline: 01/24/24:64.4% Goal status: ONGOING   ASSESSMENT:  CLINICAL IMPRESSION:  The patient is an 84 year old male, presenting with impaired balance, decreased lower extremity strength, reduced endurance, and difficulty maintaining safe gait mechanics. During todays session he required occasional rest breaks due to fatigue, along with minimal to moderate cueing for proper technique across interventions. Overground ambulation with the upwalker was completed for a total of approximately six hundred feet with instruction for optimal posture to reduce back discomfort. Additional balance and strengthening work included Airex stance and heel raises, with upper extremity support required for all repetitions of the latter. Overall he demonstrated good effort but continues to display decreased stability and limited functional endurance that elevate his fall risk. Continued physical therapy will continue to be beneficial to address the patients ongoing deficits in balance, strength, gait mechanics, and activity tolerance. He remains at high risk for further falls and would benefit from progressive therapeutic exercise, targeted balance training, and continued practice with appropriate gait patterns using recommended assistive devices. Ongoing skilled intervention will help improve safety with mobility, restore functional confidence, and reduce the likelihood of recurrent fall events.   OBJECTIVE IMPAIRMENTS: Abnormal gait, decreased balance, decreased knowledge of use of DME, decreased mobility, difficulty walking, decreased strength, decreased safety awareness, postural dysfunction, and pain.   ACTIVITY LIMITATIONS: carrying, standing, stairs, transfers, and locomotion level  PARTICIPATION LIMITATIONS: shopping and community  activity  PERSONAL FACTORS: Age, Behavior pattern, Past/current experiences, Time since onset of injury/illness/exacerbation,  and 3+ comorbidities: Hx of multiple spine surgeries, DM, HTN, prior hip fx, CKD are also affecting patient's functional outcome.   REHAB POTENTIAL: Fair Chronicity of symptoms  CLINICAL DECISION MAKING: Evolving/moderate complexity  EVALUATION COMPLEXITY: Moderate  PLAN: PT FREQUENCY: 1-2x/week PT DURATION: 12 weeks PLANNED INTERVENTIONS: 97164- PT Re-evaluation, 97110-Therapeutic exercises, 97530- Therapeutic activity, 97112- Neuromuscular re-education, [phone]- Self Care, 02883- Gait training, Patient/Family education, Balance training, Stair training, and DME instructions  PLAN FOR NEXT SESSION: Expand HEP, dynamic balance training, activity tolerance training.   6:50 AM, 03/15/24 Note: Portions of this document were prepared using Dragon voice recognition software and although reviewed may contain unintentional dictation errors in syntax, grammar, or spelling.  Lonni KATHEE Gainer PT ,DPT Physical Therapist- [facility]    "

## 2024-03-19 ENCOUNTER — Ambulatory Visit: Admitting: Physical Therapy

## 2024-03-20 ENCOUNTER — Other Ambulatory Visit (HOSPITAL_COMMUNITY): Payer: Self-pay

## 2024-03-21 ENCOUNTER — Ambulatory Visit

## 2024-03-21 DIAGNOSIS — R262 Difficulty in walking, not elsewhere classified: Secondary | ICD-10-CM

## 2024-03-21 DIAGNOSIS — R296 Repeated falls: Secondary | ICD-10-CM

## 2024-03-21 DIAGNOSIS — M6281 Muscle weakness (generalized): Secondary | ICD-10-CM

## 2024-03-21 DIAGNOSIS — R2681 Unsteadiness on feet: Secondary | ICD-10-CM

## 2024-03-21 NOTE — Therapy (Signed)
 " OUTPATIENT PHYSICAL THERAPY TREATMENT   Patient Name: Alec White MRN: [id number] DOB:March 11, 1940, 84 y.o., male Today's Date: 03/21/2024  PCP: Cleotilde Oneil FALCON, MD REFERRING PROVIDER: Cleotilde Oneil FALCON, MD  END OF SESSION:  PT End of Session - 03/21/24 1438     Visit Number 9    Number of Visits 24    Date for Recertification  04/17/24    Authorization Type [employer]: 01/24/24-04/17/24    Progress Note Due on Visit 10    PT Start Time 1445    PT Stop Time 1530    PT Time Calculation (min) 45 min    Equipment Utilized During Treatment Gait belt    Activity Tolerance Patient tolerated treatment well;No increased pain    Behavior During Therapy WFL for tasks assessed/performed         Past Medical History:  Diagnosis Date   Coronary artery disease    stents placed 20 years ago   Renal disorder    CKD stage 4   Past Surgical History:  Procedure Laterality Date   APPENDECTOMY     HIP PINNING,CANNULATED Left 11/26/2022   Procedure: PERCUTANEOUS FIXATION OF FEMORAL NECK;  Surgeon: Tobie Priest, MD;  Location: [facility] ORS;  Service: Orthopedics;  Laterality: Left;   spinal neurostimulator  08/31/2017   Patient Active Problem List   Diagnosis Date Noted   Calculus of gallbladder with acute cholecystitis without obstruction 02/10/2024   Calculus of gallbladder with acute cholecystitis and obstruction 02/10/2024   Symptomatic cholelithiasis 02/09/2024   Hypertensive urgency 02/09/2024   Dyslipidemia 02/09/2024   BPH (benign prostatic hyperplasia) 02/09/2024   Type 2 diabetes mellitus with chronic kidney disease, without long-term current use of insulin  (HCC) 02/09/2024   Acute kidney injury superimposed on chronic kidney disease 02/09/2024   Closed displaced fracture of left femoral neck (HCC) 11/25/2022   Chronic back pain 11/25/2022   Chronic use of opiate drug for therapeutic purpose 11/25/2022   CKD (chronic kidney disease) stage 4, GFR 15-29 ml/min (HCC) 11/25/2022    Hypothyroidism 11/25/2022   Essential hypertension 11/25/2022   Benign prostatic hyperplasia 06/10/2021   Type 2 diabetes mellitus with diabetic chronic kidney disease (HCC) 06/10/2021   Chronic gouty arthritis 04/20/2016   Hyperlipidemia, mixed 10/23/2015   CAD S/P percutaneous coronary angioplasty 02/04/2015   Diet-controlled diabetes mellitus (HCC) 02/04/2015   ONSET DATE: October 2024 is most recent REFERRING DIAG: R42 (ICD-10-CM) - Disequilibrium THERAPY DIAG:  Difficulty in walking, not elsewhere classified  Unsteadiness on feet  Repeated falls  Muscle weakness (generalized)  Rationale for Evaluation and Treatment: Rehabilitation  SUBJECTIVE:  SUBJECTIVE STATEMENT:  Pt reports that he is not feeling the greatest today, but denies any falls.  Pt states that he's been having some stomach issues over the past 24 hours without a significant change in his diet.  PERTINENT HISTORY:  83yoM referred to OPPT by PCP after recurrent ~4-5 falls, took PT here from 10/06/23-12/08/23. Pt sustained a fall in the house on 12/08/23, sustained SDH, followed by neurosurgical, bleed found to be stable, then pt cleared for coming back to OPPT by neurosurgical on 01/16/24. Pt denies any patterns with falls. Says it seems like his legs give away but he has also tripped leading to a fall posteriorly onto his tailbone which has been most recent. Pt previously able to get up on his own if he has something he can hold onto with his UE, however more recently he has needed assistance to get up, his wife unable to provide this degree of assistance. Pt then become ill on christmas day and went to ED, need cholecystectomy, in hospital x5 days, returns to PT on 03/06/24. PMH includes remote lumbar fusion with chronic pain, failed  implementation of pain stimulator. Pt's pain remains ~7/10 with periodic exacerbations, but is generally poorly tolerant of time in upright, and not tolerant of erect postures. Pt found success here with upwalker and purchased his own in October, essentially able to double his distance tolerance and triple his self selected gait speed. Due to suspected tripping incident (as of 01/24/24), pt using SPC as little as possible, but using RW inside and 2nd RW outside of house; upwalker reserved for longer distances in community hence, has largely remained in the car since his fall. Pt return to clinic 01/24/24 reports feelings deconditioned in general, but mostly his balance is 50% of what it was on his last PT session. Pt also reports a 2nd recent fall weekend 12/6 moving a boxed christmas trede.   PAIN:  Are you having pain? 4-5/10 low back   PRECAUTIONS: Spine stimulator in place  WEIGHT BEARING RESTRICTIONS: No FALLS: Has patient fallen in last 6 months? Yes. Number of falls 7  LIVING ENVIRONMENT: Lives with: lives with their spouse Lives in: House/apartment Stairs: Yes: Internal: flight steps; on right going up and on left going up Has following equipment at home: Single point cane, Walker - 2 wheeled, shower chair, bed side commode, and Grab bars  PLOF: Independent  PATIENT GOALS: Improve his balance and his back pain some  OBJECTIVE:  Note: Objective measures were completed at evaluation unless otherwise noted.  10/15/23 FGA 12/30  01/24/24 -5xSTS: 8.97sec hands free, mild unsteady - : 10.53sec c RW  -TUG: 14.62sec c RW  -ABC Scale: 64.4%   03/06/24 -5xSTS:  13.9sec hands free  -TUG: 13.28sec c RW  - : 11.14 sec                                                                                              TREATMENT DATE 03/21/24:     TA- To improve functional movements patterns for everyday tasks   The patient completed 10 minutes at level(s) 3-7 on the [facility]  challenge using both BUE/BLE reciprocal movements to promote strength, endurance, and cardiorespiratory fitness. PT increased the resistance level and monitored the patient's response to the intervention throughout. The patient required min cueing for technique and min level of assistance.   Overground AMB with upwalker (369ft) - instruction on proper height and posture for avoidance of back exacerbation with this.  Airex stance 3 x 30 sec  Overground AMB with upwalker (386ft)   Unless otherwise stated, SBA was provided and gait belt donned in order to ensure pt safety  Pt required occasional rest breaks due fatigue, PT was attentive to when pt appeared to be tired or winded in order to prevent excessive fatigue.  Self-Care/Home Management  Pt noted fatigue and stomach feeling queasy.  Pt also feeling light headed, so vitals were assessed:  Seated: BP: 130/65 mmHg HR: 69 bpm  Standing: (asymptomatic) BP: 93/71 mmHg HR: 82 bpm  Standing +3 Min: BP: 117/71 mmHg HR: 81 bpm  Education and training in management of orthostatic hypotension including positional transitions, symptom recognition, and home BP monitoring. Patient practiced supine-to-sit and sit-to-stand with BP monitoring and demonstrated understanding of safety strategies.     PATIENT EDUCATION: Education details: modified workouts since Person educated: Patient and Spouse Education method: Explanation Education comprehension: verbalized understanding  HOME EXERCISE PROGRAM:   GOALS: Goals reviewed with patient? No  SHORT TERM GOALS: Target date: 03/09/24  Pt will be independent with HEP to Improve balance and LE strength to reduce falls risk.  Baseline: 01/24/24: provided stepping activity at counter.  Goal status: INITIAL  LONG TERM GOALS: Target date: 04/17/24  Pt will demonstrate 5xSTS from elevated surface and feet on foam to < 13 seconds, hands free and without LOB to indicate improved postural control  during high power transfers activities.  Baseline: 10/06/23: 15.45 seconds; 11/29/23: 8.97sec hands free;  01/24/24: 8.97sec hands free, mild unsteady firm surface Goal status: NEW  2.  Pt will demonstrate performance of 1083ft AMB c upwalker at >0.25m/s without >2/10 pain increase in back to promote improved capacity for limited community distance AMB.  Baseline: 02/06/24: 642ft is farthest distance since returning Goal status: NEW  3.  Pt will improve TUG to 11 seconds or less to demonstrate reduced falls risk.  Baseline: 10/06/23: 22 seconds no AD; 01/24/24: 14.62sec (c RW);  Goal status: NEW  4.  Pt will improve ABC Scale >25% to reveal improved confidence n performing basic mobility needed for household AMB.  Baseline: 01/24/24:64.4% Goal status: ONGOING    ASSESSMENT:  CLINICAL IMPRESSION:  Patient performed well with exercises throughout the session.  Patient noted to be fatigued and pale after ambulation attempt as noted above.  Vitals were assessed as well and patient educated in regards to rationale for vitals being taken and orthostatic hypotension symptoms.  Patient was educated on these in the happenstance that he performs he is at home, and what to look for in the symptom response when changing positions.  Patient ultimately performed well with all the tasks given and no loss of balance.  Pt will continue to benefit from skilled therapy to address remaining deficits in order to improve overall QoL and return to PLOF.      OBJECTIVE IMPAIRMENTS: Abnormal gait, decreased balance, decreased knowledge of use of DME, decreased mobility, difficulty walking, decreased strength, decreased safety awareness, postural dysfunction, and pain.   ACTIVITY LIMITATIONS: carrying, standing, stairs, transfers, and locomotion level  PARTICIPATION LIMITATIONS: shopping and community activity  PERSONAL FACTORS: Age, Behavior pattern, Past/current  experiences, Time since onset of  injury/illness/exacerbation, and 3+ comorbidities: Hx of multiple spine surgeries, DM, HTN, prior hip fx, CKD are also affecting patient's functional outcome.   REHAB POTENTIAL: Fair Chronicity of symptoms  CLINICAL DECISION MAKING: Evolving/moderate complexity  EVALUATION COMPLEXITY: Moderate  PLAN: PT FREQUENCY: 1-2x/week PT DURATION: 12 weeks PLANNED INTERVENTIONS: 97164- PT Re-evaluation, 97110-Therapeutic exercises, 97530- Therapeutic activity, [id number]- Neuromuscular re-education, 97535- Self Care, [id number]- Gait training, Patient/Family education, Balance training, Stair training, and DME instructions  PLAN FOR NEXT SESSION: Expand HEP, dynamic balance training, activity tolerance training.    Note: Portions of this document were prepared using Dragon voice recognition software and although reviewed may contain unintentional dictation errors in syntax, grammar, or spelling.   Fonda Simpers, PT, DPT Physical Therapist - [facility]  03/21/24, 3:56 PM   "

## 2024-03-26 ENCOUNTER — Ambulatory Visit: Admitting: Physical Therapy

## 2024-03-28 ENCOUNTER — Ambulatory Visit

## 2024-04-03 ENCOUNTER — Ambulatory Visit: Admitting: Physical Therapy

## 2024-04-05 ENCOUNTER — Ambulatory Visit

## 2024-04-09 ENCOUNTER — Ambulatory Visit: Admitting: Physical Therapy

## 2024-04-11 ENCOUNTER — Ambulatory Visit: Admitting: Physical Therapy

## 2024-04-16 ENCOUNTER — Ambulatory Visit

## 2024-04-18 ENCOUNTER — Ambulatory Visit: Admitting: Physical Therapy

## 2024-04-23 ENCOUNTER — Ambulatory Visit: Admitting: Physical Therapy

## 2024-04-25 ENCOUNTER — Ambulatory Visit: Admitting: Physical Therapy

## 2024-04-30 ENCOUNTER — Ambulatory Visit: Admitting: Physical Therapy

## 2024-05-02 ENCOUNTER — Ambulatory Visit

## 2024-05-07 ENCOUNTER — Ambulatory Visit: Admitting: Physical Therapy

## 2024-05-09 ENCOUNTER — Ambulatory Visit: Admitting: Physical Therapy

## 2024-05-21 ENCOUNTER — Ambulatory Visit: Admitting: Physical Therapy

## 2024-05-23 ENCOUNTER — Ambulatory Visit

## 2024-05-28 ENCOUNTER — Ambulatory Visit: Admitting: Physical Therapy

## 2024-05-30 ENCOUNTER — Ambulatory Visit
